# Patient Record
Sex: Female | Born: 1995 | Race: Black or African American | Hispanic: No | Marital: Single | Smoking: Never smoker
Health system: Southern US, Community
[De-identification: ages and names within clinical notes are randomized; demographics above are authoritative.]

## PROBLEM LIST (undated history)

## (undated) DIAGNOSIS — D649 Anemia, unspecified: Secondary | ICD-10-CM

---
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
## 2017-03-17 ENCOUNTER — Emergency Department (HOSPITAL_BASED_OUTPATIENT_CLINIC_OR_DEPARTMENT_OTHER)
Admission: EM | Admit: 2017-03-17 | Discharge: 2017-03-17 | Disposition: A | Discharge status: Home / Self Care | Payer: Medicaid Other | Attending: Emergency Medicine | Admitting: Emergency Medicine

## 2017-03-17 ENCOUNTER — Encounter (HOSPITAL_BASED_OUTPATIENT_CLINIC_OR_DEPARTMENT_OTHER): Payer: Self-pay | Admitting: Emergency Medicine

## 2017-03-17 DIAGNOSIS — R197 Diarrhea, unspecified: Secondary | ICD-10-CM

## 2017-03-17 DIAGNOSIS — K529 Noninfective gastroenteritis and colitis, unspecified: Secondary | ICD-10-CM | POA: Diagnosis not present

## 2017-03-17 DIAGNOSIS — R112 Nausea with vomiting, unspecified: Secondary | ICD-10-CM

## 2017-03-17 HISTORY — DX: Anemia, unspecified: D64.9

## 2017-03-17 LAB — COMPREHENSIVE METABOLIC PANEL
ALT: 14 U/L (ref 14–54)
ANION GAP: 5 (ref 5–15)
AST: 23 U/L (ref 15–41)
Albumin: 3.9 g/dL (ref 3.5–5.0)
Alkaline Phosphatase: 86 U/L (ref 38–126)
BUN: 11 mg/dL (ref 6–20)
CHLORIDE: 108 mmol/L (ref 101–111)
CO2: 23 mmol/L (ref 22–32)
Calcium: 9.4 mg/dL (ref 8.9–10.3)
Creatinine, Ser: 0.72 mg/dL (ref 0.44–1.00)
GFR calc non Af Amer: >60 mL/min (ref >60)
Glucose, Bld: 91 mg/dL (ref 65–99)
POTASSIUM: 3.7 mmol/L (ref 3.5–5.1)
SODIUM: 136 mmol/L (ref 135–145)
Total Bilirubin: 0.5 mg/dL (ref 0.3–1.2)
Total Protein: 7.9 g/dL (ref 6.5–8.1)

## 2017-03-17 LAB — CBC WITH DIFFERENTIAL/PLATELET
BASOS PCT: 1 %
Basophils Absolute: 0 10*3/uL (ref 0.0–0.1)
EOS PCT: 1 %
Eosinophils Absolute: 0 10*3/uL (ref 0.0–0.7)
HEMATOCRIT: 25.6 % — AB (ref 36.0–46.0)
HEMOGLOBIN: 7.6 g/dL — AB (ref 12.0–15.0)
LYMPHS PCT: 52 %
Lymphs Abs: 1.7 10*3/uL (ref 0.7–4.0)
MCH: 20.3 pg — ABNORMAL LOW (ref 26.0–34.0)
MCHC: 29.7 g/dL — ABNORMAL LOW (ref 30.0–36.0)
MCV: 68.4 fL — AB (ref 78.0–100.0)
MONOS PCT: 9 %
Monocytes Absolute: 0.3 10*3/uL (ref 0.1–1.0)
NEUTROS PCT: 37 %
Neutro Abs: 1.2 10*3/uL — ABNORMAL LOW (ref 1.7–7.7)
Platelets: 282 10*3/uL (ref 150–400)
RBC: 3.74 MIL/uL — ABNORMAL LOW (ref 3.87–5.11)
RDW: 18.4 % — ABNORMAL HIGH (ref 11.5–15.5)
WBC: 3.2 10*3/uL — ABNORMAL LOW (ref 4.0–10.5)

## 2017-03-17 LAB — URINALYSIS, ROUTINE W REFLEX MICROSCOPIC
BILIRUBIN URINE: NEGATIVE
Glucose, UA: NEGATIVE mg/dL
Hgb urine dipstick: NEGATIVE
Ketones, ur: NEGATIVE mg/dL
Leukocytes, UA: NEGATIVE
Nitrite: NEGATIVE
PROTEIN: NEGATIVE mg/dL
SPECIFIC GRAVITY, URINE: 1.01 (ref 1.005–1.030)
pH: 6 (ref 5.0–8.0)

## 2017-03-17 LAB — LIPASE, BLOOD: Lipase: 32 U/L (ref 11–51)

## 2017-03-17 LAB — PREGNANCY, URINE: Preg Test, Ur: NEGATIVE

## 2017-03-17 MED ORDER — DICYCLOMINE HCL 10 MG PO CAPS
10.0000 mg | ORAL_CAPSULE | Freq: Once | ORAL | Status: AC
  Administered 2017-03-17: 10 mg via ORAL
  Filled 2017-03-17: qty 1

## 2017-03-17 MED ORDER — DICYCLOMINE HCL 20 MG PO TABS: 20.0000 mg | ORAL_TABLET | Freq: Two times a day (BID) | ORAL | 0 refills | Status: DC

## 2017-03-17 MED ORDER — ONDANSETRON 4 MG PO TBDP
4.0000 mg | ORAL_TABLET | Freq: Once | ORAL | Status: AC
Start: 2017-03-17 — End: 2017-03-17
  Administered 2017-03-17: 4 mg via ORAL
  Filled 2017-03-17: qty 1

## 2017-03-17 MED ORDER — SODIUM CHLORIDE 0.9 % IV BOLUS (SEPSIS)
1000.0000 mL | Freq: Once | INTRAVENOUS | Status: AC
  Administered 2017-03-17: 1000 mL via INTRAVENOUS

## 2017-03-17 MED ORDER — ONDANSETRON HCL 4 MG PO TABS: 4.0000 mg | ORAL_TABLET | Freq: Three times a day (TID) | ORAL | 0 refills | Status: DC | PRN

## 2017-03-17 MED FILL — ONDANSETRON HCL 4 MG TABLET: 4 | 4 days supply | Qty: 10 | Fill #0

## 2017-03-17 MED FILL — DICYCLOMINE 20 MG TABLET: 20 | 10 days supply | Qty: 20 | Fill #0

## 2017-03-17 NOTE — ED Notes (Signed)
Pt states she has been around other people at school with similar Sx

## 2017-03-17 NOTE — ED Provider Notes (Signed)
[facility]   CSN: [id number] Arrival date & time: 03/17/17  1016     History   Chief Complaint Chief Complaint  Patient presents with  . Emesis    HPI Morgan Myers is a 21 y.o. female with a history of chronic iron deficiency anemia who presents emergency Department with chief complaint of nausea vomiting diarrhea. Patient states she had onset of nausea vomiting diarrhea yesterday. She had 2 episodes of voluminous vomiting and diarrhea. She denies any bloody or bilious vomitus and denies any melena or hematochezia. Stool was brown and watery. She had a friend at school who are having the same issues yesterday. She has some weakness when she stands and feels a bit lightheaded but denies any current nausea or vomiting. She did have some stomach cramps but no focal abdominal pain. She denies urinary or vaginal symptoms.  HPI  Past Medical History:  Diagnosis Date  . Anemia     There are no active problems to display for this patient.   History reviewed. No pertinent surgical history.  OB History    No data available       Home Medications    Prior to Admission medications   Not on File    Family History No family history on file.  Social History Social History  Substance Use Topics  . Smoking status: Never Smoker  . Smokeless tobacco: Never Used  . Alcohol use No     Allergies   Sulfa antibiotics   Review of Systems Review of Systems Ten systems reviewed and are negative for acute change, except as noted in the HPI.   Physical Exam Updated Vital Signs BP 113/73 (BP Location: Left Arm)   Pulse 81   Temp 98.5 F (36.9 C) (Oral)   Resp 18   Ht 5\' 4"  (1.626 m)   Wt 55.8 kg (123 lb)   LMP 03/09/2017   SpO2 100%   BMI 21.11 kg/m   Physical Exam  Physical Exam  Nursing note and vitals reviewed. Constitutional: She is oriented to person, place, and time. She appears well-developed and well-nourished. No distress.  HENT:    Head: Normocephalic and atraumatic.  Eyes: Conjunctivae normal and EOM are normal. Pupils are equal, round, and reactive to light. No scleral icterus.  Neck: Normal range of motion.  Cardiovascular: Normal rate, regular rhythm and normal heart sounds.  Exam reveals no gallop and no friction rub.   No murmur heard. Pulmonary/Chest: Effort normal and breath sounds normal. No respiratory distress.  Abdominal: Soft. Bowel sounds are normal. She exhibits no distension and no mass. There is no tenderness. There is no guarding.  Neurological: She is alert and oriented to person, place, and time.  Skin: Skin is warm and dry. She is not diaphoretic.    ED Treatments / Results  Labs (all labs ordered are listed, but only abnormal results are displayed) Labs Reviewed  CBC WITH DIFFERENTIAL/PLATELET - Abnormal; Notable for the following:       Result Value   WBC 3.2 (*)    RBC 3.74 (*)    Hemoglobin 7.6 (*)    HCT 25.6 (*)    MCV 68.4 (*)    MCH 20.3 (*)    MCHC 29.7 (*)    RDW 18.4 (*)    All other components within normal limits  URINALYSIS, ROUTINE W REFLEX MICROSCOPIC  PREGNANCY, URINE  COMPREHENSIVE METABOLIC PANEL  LIPASE, BLOOD    EKG  EKG Interpretation None  Radiology No results found.  Procedures Procedures (including critical care time)  Medications Ordered in ED Medications  sodium chloride 0.9 % bolus 1,000 mL (1,000 mLs Intravenous New Bag/Given 03/17/17 1235)  dicyclomine (BENTYL) capsule 10 mg (10 mg Oral Given 03/17/17 1242)  ondansetron (ZOFRAN-ODT) disintegrating tablet 4 mg (4 mg Oral Given 03/17/17 1243)     Initial Impression / Assessment and Plan / ED Course  I have reviewed the triage vital signs and the nursing notes.  Pertinent labs & imaging results that were available during my care of the patient were reviewed by me and considered in my medical decision making (see chart for details).     Patient with symptoms consistent with viral  gastroenteritis.  Vitals are stable, no fever.  No signs of dehydration, tolerating PO fluids > 6 oz.  Lungs are clear.  No focal abdominal pain, no concern for appendicitis, cholecystitis, pancreatitis, ruptured viscus, UTI, kidney stone, or any other abdominal etiology.  Supportive therapy indicated with return if symptoms worsen.  Patient counseled.   Final Clinical Impressions(s) / ED Diagnoses   Final diagnoses:  Nausea vomiting and diarrhea  Gastroenteritis    New Prescriptions New Prescriptions   No medications on file     Arthor Captain, PA-C 03/17/17 1308    Tilden Fossa, MD 03/20/17 1002

## 2017-03-17 NOTE — Discharge Instructions (Signed)

## 2017-03-17 NOTE — ED Notes (Signed)
Pt has a ride a bedside. Directed to pharmacy to pick up medications

## 2017-03-17 NOTE — ED Triage Notes (Signed)
Pt states she has had N/V/D since yesterday.  Pt states she has had 2 episodes of vomiting and diarrhea.  NAD.  Pt states friends at school have been sick with same.

## 2017-09-21 ENCOUNTER — Encounter (HOSPITAL_BASED_OUTPATIENT_CLINIC_OR_DEPARTMENT_OTHER): Payer: Self-pay | Admitting: Emergency Medicine

## 2017-09-21 ENCOUNTER — Other Ambulatory Visit: Payer: Self-pay

## 2017-09-21 ENCOUNTER — Emergency Department (HOSPITAL_BASED_OUTPATIENT_CLINIC_OR_DEPARTMENT_OTHER)
Admission: EM | Admit: 2017-09-21 | Discharge: 2017-09-21 | Disposition: A | Discharge status: Home / Self Care | Payer: Medicaid Other | Attending: Emergency Medicine | Admitting: Emergency Medicine

## 2017-09-21 DIAGNOSIS — R55 Syncope and collapse: Secondary | ICD-10-CM

## 2017-09-21 DIAGNOSIS — R42 Dizziness and giddiness: Secondary | ICD-10-CM | POA: Diagnosis present

## 2017-09-21 DIAGNOSIS — I951 Orthostatic hypotension: Secondary | ICD-10-CM

## 2017-09-21 LAB — BASIC METABOLIC PANEL
Anion gap: 8 (ref 5–15)
BUN: 11 mg/dL (ref 6–20)
CHLORIDE: 107 mmol/L (ref 101–111)
CO2: 22 mmol/L (ref 22–32)
CREATININE: 0.85 mg/dL (ref 0.44–1.00)
Calcium: 9.3 mg/dL (ref 8.9–10.3)
GFR calc Af Amer: >60 mL/min (ref >60)
GFR calc non Af Amer: >60 mL/min (ref >60)
GLUCOSE: 95 mg/dL (ref 65–99)
POTASSIUM: 3.7 mmol/L (ref 3.5–5.1)
Sodium: 137 mmol/L (ref 135–145)

## 2017-09-21 LAB — CBC
HEMATOCRIT: 26.7 % — AB (ref 36.0–46.0)
Hemoglobin: 7.8 g/dL — ABNORMAL LOW (ref 12.0–15.0)
MCH: 19.6 pg — ABNORMAL LOW (ref 26.0–34.0)
MCHC: 29.2 g/dL — AB (ref 30.0–36.0)
MCV: 67.1 fL — AB (ref 78.0–100.0)
Platelets: 338 10*3/uL (ref 150–400)
RBC: 3.98 MIL/uL (ref 3.87–5.11)
RDW: 18 % — AB (ref 11.5–15.5)
WBC: 5 10*3/uL (ref 4.0–10.5)

## 2017-09-21 LAB — URINALYSIS, ROUTINE W REFLEX MICROSCOPIC
BILIRUBIN URINE: NEGATIVE
GLUCOSE, UA: NEGATIVE mg/dL
HGB URINE DIPSTICK: NEGATIVE
KETONES UR: NEGATIVE mg/dL
Nitrite: NEGATIVE
PH: 6 (ref 5.0–8.0)
Protein, ur: NEGATIVE mg/dL
Specific Gravity, Urine: 1.025 (ref 1.005–1.030)

## 2017-09-21 LAB — URINALYSIS, MICROSCOPIC (REFLEX)

## 2017-09-21 LAB — PREGNANCY, URINE: PREG TEST UR: NEGATIVE

## 2017-09-21 LAB — CBG MONITORING, ED: Glucose-Capillary: 70 mg/dL (ref 65–99)

## 2017-09-21 MED ORDER — SODIUM CHLORIDE 0.9 % IV BOLUS (SEPSIS)
1000.0000 mL | Freq: Once | INTRAVENOUS | Status: AC
  Administered 2017-09-21: 1000 mL via INTRAVENOUS

## 2017-09-21 NOTE — ED Triage Notes (Signed)
Pt reports she was at school today and became dizzy with darkness to her vision around 2pm. Pt now reports she has a headache. Pt has not eaten today.

## 2017-09-21 NOTE — ED Notes (Signed)
Pt was given snack and something to drink per provider

## 2017-09-21 NOTE — Discharge Instructions (Signed)
As we discussed, you need to follow-up with your primary care doctor regarding your anemia.  Make sure you are eating and drinking appropriately.  Follow-up with your primary care doctor the next 24-48 hours for further evaluation.  Return to the emergency department for any fever, chest pain, dizziness, vision changes, difficulty walking, numbness/weakness of your arms or legs.

## 2017-09-21 NOTE — ED Provider Notes (Signed)
[facility] EMERGENCY DEPARTMENT Provider Note   CSN: [id number] Arrival date & time: 09/21/17  1558     History   Chief Complaint Chief Complaint  Patient presents with  . Dizziness    HPI Morgan Myers is a 22 y.o. female who presents for evaluation of lightheadedness and dizziness that occurred this afternoon. Patient reports that she was at school when she started feeling lightheaded and dizziness. Patient was concerned that she would pass out. Patient reports she had associated blurry vision that she describes as "black spots everywhere like when you've been looking at the sun too long." She states that she never had any complete vision loss. Patient states that she did not eat anything all day. Patient states that the symptoms resolved on their own.  Patient states that she did not have any preceding trauma, injury, fall.  On ED arrival, symptoms have resolved.  Patient denies any chest pain, difficulty breathing, abdominal pain, nausea/vomiting, numbness/weakness of her arms or legs.  The history is provided by the patient.    Past Medical History:  Diagnosis Date  . Anemia     There are no active problems to display for this patient.   History reviewed. No pertinent surgical history.  OB History    No data available       Home Medications    Prior to Admission medications   Medication Sig Start Date End Date Taking? Authorizing Provider  dicyclomine (BENTYL) 20 MG tablet Take 1 tablet (20 mg total) by mouth 2 (two) times daily. 03/17/17   Arthor Captain, PA-C  ondansetron (ZOFRAN) 4 MG tablet Take 1 tablet (4 mg total) by mouth every 8 (eight) hours as needed for nausea or vomiting. 03/17/17   Arthor Captain, PA-C    Family History No family history on file.  Social History Social History   Tobacco Use  . Smoking status: Never Smoker  . Smokeless tobacco: Never Used  Substance Use Topics  . Alcohol use: No  . Drug use: No     Allergies     Sulfa antibiotics   Review of Systems Review of Systems  Constitutional: Negative for chills and fever.  HENT: Negative for congestion.   Eyes: Positive for visual disturbance.  Respiratory: Negative for cough and shortness of breath.   Cardiovascular: Negative for chest pain.  Gastrointestinal: Negative for abdominal pain, diarrhea, nausea and vomiting.  Genitourinary: Negative for dysuria and hematuria.  Musculoskeletal: Negative for back pain and neck pain.  Skin: Negative for rash.  Neurological: Positive for dizziness. Negative for weakness, numbness and headaches.  Psychiatric/Behavioral: Negative for confusion.     Physical Exam Updated Vital Signs BP 110/71 (BP Location: Left Arm)   Pulse 93   Temp 98.7 F (37.1 C) (Oral)   Resp (!) 23   Ht 5\' 4"  (1.626 m)   LMP 09/14/2017   SpO2 100%   BMI 21.11 kg/m   Physical Exam  Constitutional: She is oriented to person, place, and time. She appears well-developed and well-nourished.  HENT:  Head: Normocephalic and atraumatic.  Mouth/Throat: Oropharynx is clear and moist and mucous membranes are normal.  Eyes: Conjunctivae, EOM and lids are normal. Pupils are equal, round, and reactive to light.  No nystagmus  Neck: Full passive range of motion without pain.  Cardiovascular: Normal rate, regular rhythm, normal heart sounds and normal pulses. Exam reveals no gallop and no friction rub.  No murmur heard. Pulmonary/Chest: Effort normal and breath sounds normal.  Abdominal: Soft.  Normal appearance. There is no tenderness. There is no rigidity and no guarding.  Musculoskeletal: Normal range of motion.  Neurological: She is alert and oriented to person, place, and time.  Cranial nerves III-XII intact Follows commands, Moves all extremities  5/5 strength to BUE and BLE  Sensation intact throughout all major nerve distributions Normal finger to nose. No dysdiadochokinesia. No pronator drift. No gait abnormalities  No  slurred speech. No facial droop.   Skin: Skin is warm and dry. Capillary refill takes less than 2 seconds.  Psychiatric: She has a normal mood and affect. Her speech is normal.  Nursing note and vitals reviewed.    ED Treatments / Results  Labs (all labs ordered are listed, but only abnormal results are displayed) Labs Reviewed  CBC - Abnormal; Notable for the following components:      Result Value   Hemoglobin 7.8 (*)    HCT 26.7 (*)    MCV 67.1 (*)    MCH 19.6 (*)    MCHC 29.2 (*)    RDW 18.0 (*)    All other components within normal limits  URINALYSIS, ROUTINE W REFLEX MICROSCOPIC - Abnormal; Notable for the following components:   APPearance HAZY (*)    Leukocytes, UA TRACE (*)    All other components within normal limits  URINALYSIS, MICROSCOPIC (REFLEX) - Abnormal; Notable for the following components:   Bacteria, UA MANY (*)    Squamous Epithelial / LPF 6-30 (*)    All other components within normal limits  BASIC METABOLIC PANEL  PREGNANCY, URINE  CBG MONITORING, ED    EKG  EKG Interpretation  Date/Time:  Tuesday September 21 2017 20:12:04 EDT Ventricular Rate:  78 PR Interval:    QRS Duration: 87 QT Interval:  360 QTC Calculation: 410 R Axis:   64 Text Interpretation:  Sinus rhythm No old tracing to compare Confirmed by Rolan Bucco [phone]) on 09/21/2017 9:01:05 PM       Radiology No results found.  Procedures Procedures (including critical care time)  Medications Ordered in ED Medications  sodium chloride 0.9 % bolus 1,000 mL (0 mLs Intravenous Stopped 09/21/17 2140)     Initial Impression / Assessment and Plan / ED Course  I have reviewed the triage vital signs and the nursing notes.  Pertinent labs & imaging results that were available during my care of the patient were reviewed by me and considered in my medical decision making (see chart for details).     22 y.o. F who presents for evaluation of lightheadedness, dizziness, near syncope that  occurred this afternoon.  Associate with some biliary vision.  Prevent to have resolved on their own.  On ED arrival, patient is symptomatic.  No chest pain, difficulty breathing, fever. Patient is afebrile, non-toxic appearing, sitting comfortably on examination table. Vital signs reviewed and stable.  No neuro deficits noted on exam.  Patient is able to ambulate in the department without any difficulty.  Consider dehydration versus orthostatic hypotension versus near syncope.  History/physical exam is not concerning for CVA, TIA.  Plan to check basic labs, EKG, orthostatic vital signs.  Will plan to give IVF for fluid resuscitation.  CBC with no significant leukocytosis.  Patient does have anemia at 7.8 and 26.7.  Patient has a known history of anemia.  Patient reports she is supposed to be taking oral iron pills that she states that she does not tolerate them.  She denies any melena, bright red blood per rectum, bleeding.  Records  reviewed to this consistent with her hemoglobin and hematocrit that were 6 months ago.   BMP is unremarkable.  UA shows trace leukocytes.patient was orthostatic, she is finishing her bolus of fluids.  Reevaluation.  Patient is ambulating in the part without any difficulty.  She is tolerating p.o. in the department without any difficulty.  I suspect the symptoms may be related to orthostatic hypotension or patient's anemia.  Instructed patient to follow-up with her pediatrician in order to have anemia reevaluated and see if she can have iron supplementation.  Encouraged her to take her oral iron pills.  Vitals stable at this time. Patient had ample opportunity for questions and discussion. All patient's questions were answered with full understanding. Strict return precautions discussed. Patient expresses understanding and agreement to plan.    Orthostatic VS for the past 24 hrs:  BP- Lying Pulse- Lying BP- Sitting Pulse- Sitting BP- Standing at 0 minutes Pulse- Standing at 0  minutes  09/21/17 1957 115/75 88 121/81 80 107/74 98      Final Clinical Impressions(s) / ED Diagnoses   Final diagnoses:  Near syncope  Orthostatic hypotension    ED Discharge Orders    None       Maxwell CaulLayden, Paulanthony Gleaves A, PA-C 09/22/17 0255    Rolan BuccoBelfi, Melanie, MD 09/22/17 1421

## 2018-02-09 ENCOUNTER — Encounter (HOSPITAL_BASED_OUTPATIENT_CLINIC_OR_DEPARTMENT_OTHER): Payer: Self-pay

## 2018-02-09 ENCOUNTER — Emergency Department (HOSPITAL_BASED_OUTPATIENT_CLINIC_OR_DEPARTMENT_OTHER)
Admission: EM | Admit: 2018-02-09 | Discharge: 2018-02-10 | Disposition: A | Discharge status: Home / Self Care | Payer: Self-pay | Attending: Emergency Medicine | Admitting: Emergency Medicine

## 2018-02-09 ENCOUNTER — Other Ambulatory Visit: Payer: Self-pay

## 2018-02-09 DIAGNOSIS — R1084 Generalized abdominal pain: Secondary | ICD-10-CM

## 2018-02-09 LAB — PREGNANCY, URINE: Preg Test, Ur: NEGATIVE

## 2018-02-09 LAB — URINALYSIS, ROUTINE W REFLEX MICROSCOPIC
Bilirubin Urine: NEGATIVE
GLUCOSE, UA: NEGATIVE mg/dL
Hgb urine dipstick: NEGATIVE
KETONES UR: NEGATIVE mg/dL
Nitrite: NEGATIVE
PROTEIN: NEGATIVE mg/dL
Specific Gravity, Urine: 1.01 (ref 1.005–1.030)
pH: 6.5 (ref 5.0–8.0)

## 2018-02-09 LAB — URINALYSIS, MICROSCOPIC (REFLEX)

## 2018-02-09 NOTE — ED Provider Notes (Signed)
[facility] EMERGENCY DEPARTMENT Provider Note   CSN: [id number] Arrival date & time: 02/09/18  2232     History   Chief Complaint Chief Complaint  Patient presents with  . Abdominal Pain    HPI Morgan Myers is a 22 y.o. female.  Presents to the emergency department for evaluation of abdominal pain.  Patient reports that her pain began yesterday.  She had several hours of diffuse crampy pain that ultimately went away without intervention.  Pain came back tonight while she was at work.  She denies nausea, vomiting, diarrhea, constipation.  She has not had any urinary symptoms.  She denies vaginal bleeding, discharge.     Past Medical History:  Diagnosis Date  . Anemia     There are no active problems to display for this patient.   History reviewed. No pertinent surgical history.   OB History   None      Home Medications    Prior to Admission medications   Medication Sig Start Date End Date Taking? Authorizing Provider  dicyclomine (BENTYL) 20 MG tablet Take 1 tablet (20 mg total) by mouth 2 (two) times daily as needed for spasms. 02/10/18   Gilda Crease, MD  ibuprofen (ADVIL,MOTRIN) 800 MG tablet Take 1 tablet (800 mg total) by mouth every 8 (eight) hours as needed for moderate pain. 02/10/18   Gilda Crease, MD    Family History No family history on file.  Social History Social History   Tobacco Use  . Smoking status: Never Smoker  . Smokeless tobacco: Never Used  Substance Use Topics  . Alcohol use: No  . Drug use: No     Allergies   Sulfa antibiotics   Review of Systems Review of Systems  Gastrointestinal: Positive for abdominal pain.  All other systems reviewed and are negative.    Physical Exam Updated Vital Signs BP 139/84 (BP Location: Left Arm)   Pulse 92   Temp 98 F (36.7 C) (Oral)   Resp 18   Ht 5\' 4"  (1.626 m)   Wt 56.7 kg (125 lb)   LMP 01/26/2018 (Approximate)   SpO2 100%   BMI 21.46 kg/m    Physical Exam  Constitutional: She is oriented to person, place, and time. She appears well-developed and well-nourished. No distress.  HENT:  Head: Normocephalic and atraumatic.  Right Ear: Hearing normal.  Left Ear: Hearing normal.  Nose: Nose normal.  Mouth/Throat: Oropharynx is clear and moist and mucous membranes are normal.  Eyes: Pupils are equal, round, and reactive to light. Conjunctivae and EOM are normal.  Neck: Normal range of motion. Neck supple.  Cardiovascular: Regular rhythm, S1 normal and S2 normal. Exam reveals no gallop and no friction rub.  No murmur heard. Pulmonary/Chest: Effort normal and breath sounds normal. No respiratory distress. She exhibits no tenderness.  Abdominal: Soft. Normal appearance and bowel sounds are normal. There is no hepatosplenomegaly. There is generalized tenderness (Tenderness is generalized, all 4 quadrants but is worse in the left upper quadrant and left lower quadrant). There is no rebound, no guarding, no tenderness at McBurney's point and negative Murphy's sign. No hernia.  Musculoskeletal: Normal range of motion.  Neurological: She is alert and oriented to person, place, and time. She has normal strength. No cranial nerve deficit or sensory deficit. Coordination normal. GCS eye subscore is 4. GCS verbal subscore is 5. GCS motor subscore is 6.  Skin: Skin is warm, dry and intact. No rash noted. No cyanosis.  Psychiatric:  She has a normal mood and affect. Her speech is normal and behavior is normal. Thought content normal.  Nursing note and vitals reviewed.    ED Treatments / Results  Labs (all labs ordered are listed, but only abnormal results are displayed) Labs Reviewed  CBC WITH DIFFERENTIAL/PLATELET - Abnormal; Notable for the following components:      Result Value   Hemoglobin 8.2 (*)    HCT 27.8 (*)    MCV 65.6 (*)    MCH 19.3 (*)    MCHC 29.5 (*)    RDW 18.3 (*)    All other components within normal limits   COMPREHENSIVE METABOLIC PANEL - Abnormal; Notable for the following components:   Total Protein 8.5 (*)    All other components within normal limits  URINALYSIS, ROUTINE W REFLEX MICROSCOPIC - Abnormal; Notable for the following components:   APPearance CLOUDY (*)    Leukocytes, UA LARGE (*)    All other components within normal limits  URINALYSIS, MICROSCOPIC (REFLEX) - Abnormal; Notable for the following components:   Bacteria, UA MANY (*)    All other components within normal limits  LIPASE, BLOOD  PREGNANCY, URINE    EKG None  Radiology Dg Abd Acute W/chest  Result Date: 02/10/2018 CLINICAL DATA:  Abdominal pain. EXAM: DG ABDOMEN ACUTE W/ 1V CHEST COMPARISON:  None. FINDINGS: The cardiomediastinal contours are normal. The lungs are clear. There is no free intra-abdominal air. No dilated bowel loops to suggest obstruction. Small volume of stool throughout the colon. No radiopaque calculi. No acute osseous abnormalities are seen. Trace dextroscoliotic curvature of the thoracolumbar spine. IMPRESSION: 1. Unremarkable radiographs of the chest and abdomen. 2. Mild thoracolumbar scoliosis. Electronically Signed   By: Rubye Oaks M.D.   On: 02/10/2018 01:21    Procedures Procedures (including critical care time)  Medications Ordered in ED Medications  morphine 2 MG/ML injection 2 mg (has no administration in time range)  ondansetron (ZOFRAN) injection 4 mg (has no administration in time range)     Initial Impression / Assessment and Plan / ED Course  I have reviewed the triage vital signs and the nursing notes.  Pertinent labs & imaging results that were available during my care of the patient were reviewed by me and considered in my medical decision making (see chart for details).     Patient presents to the emergency department for evaluation of abdominal pain.  Pain started after eating this morning.  She also had an episode yesterday that spontaneously resolved.   Examination is nonfocal.  She has diffuse tenderness but does not have any guarding or rebound.  Tenderness is worse in the left upper and left lower than it is on the right, therefore low suspicion for gallbladder disease and appendicitis at this time.  Lab work is normal.  Urinalysis is a contaminated specimen, but symptoms unlikely to be secondary to UTI.  X-ray unremarkable.  Discussed with patient options.  Discussed symptomatic treatment and monitoring over the next 12 hours or so, return if symptoms worsen versus doing a CAT scan.  Discussed risks of radiation exposure.  Patient expresses understanding of this conversation and is comfortable going home with some treatment of her symptoms and agrees to return if she develops a fever or worsening pain.  She understands that we have not ruled out appendicitis at this time.  Final Clinical Impressions(s) / ED Diagnoses   Final diagnoses:  Generalized abdominal pain    ED Discharge Orders  Ordered    dicyclomine (BENTYL) 20 MG tablet  2 times daily PRN     02/10/18 0138    ibuprofen (ADVIL,MOTRIN) 800 MG tablet  Every 8 hours PRN     02/10/18 0138       Gilda CreasePollina, Maliyah Willets J, MD 02/10/18 [phone]

## 2018-02-09 NOTE — ED Triage Notes (Signed)
C/o abd started after eating questionable food-denies v/d-NAD-steady gait

## 2018-02-10 ENCOUNTER — Emergency Department (HOSPITAL_BASED_OUTPATIENT_CLINIC_OR_DEPARTMENT_OTHER): Payer: Self-pay

## 2018-02-10 LAB — COMPREHENSIVE METABOLIC PANEL
ALT: 12 U/L (ref 0–44)
AST: 24 U/L (ref 15–41)
Albumin: 4.2 g/dL (ref 3.5–5.0)
Alkaline Phosphatase: 97 U/L (ref 38–126)
Anion gap: 8 (ref 5–15)
BUN: 6 mg/dL (ref 6–20)
CO2: 24 mmol/L (ref 22–32)
Calcium: 9.4 mg/dL (ref 8.9–10.3)
Chloride: 103 mmol/L (ref 98–111)
Creatinine, Ser: 0.77 mg/dL (ref 0.44–1.00)
Glucose, Bld: 99 mg/dL (ref 70–99)
POTASSIUM: 3.5 mmol/L (ref 3.5–5.1)
Sodium: 135 mmol/L (ref 135–145)
TOTAL PROTEIN: 8.5 g/dL — AB (ref 6.5–8.1)
Total Bilirubin: 0.9 mg/dL (ref 0.3–1.2)

## 2018-02-10 LAB — CBC WITH DIFFERENTIAL/PLATELET
BASOS ABS: 0 10*3/uL (ref 0.0–0.1)
Basophils Relative: 0 %
EOS ABS: 0.1 10*3/uL (ref 0.0–0.7)
Eosinophils Relative: 1 %
HCT: 27.8 % — ABNORMAL LOW (ref 36.0–46.0)
Hemoglobin: 8.2 g/dL — ABNORMAL LOW (ref 12.0–15.0)
LYMPHS ABS: 2.2 10*3/uL (ref 0.7–4.0)
Lymphocytes Relative: 45 %
MCH: 19.3 pg — ABNORMAL LOW (ref 26.0–34.0)
MCHC: 29.5 g/dL — AB (ref 30.0–36.0)
MCV: 65.6 fL — ABNORMAL LOW (ref 78.0–100.0)
Monocytes Absolute: 0.4 10*3/uL (ref 0.1–1.0)
Monocytes Relative: 8 %
NEUTROS ABS: 2.2 10*3/uL (ref 1.7–7.7)
Neutrophils Relative %: 46 %
Platelets: 266 10*3/uL (ref 150–400)
RBC: 4.24 MIL/uL (ref 3.87–5.11)
RDW: 18.3 % — AB (ref 11.5–15.5)
WBC: 4.9 10*3/uL (ref 4.0–10.5)

## 2018-02-10 LAB — LIPASE, BLOOD: LIPASE: 32 U/L (ref 11–51)

## 2018-02-10 MED ORDER — DICYCLOMINE HCL 20 MG PO TABS: 20.0000 mg | ORAL_TABLET | Freq: Two times a day (BID) | ORAL | 0 refills | Status: DC | PRN

## 2018-02-10 MED ORDER — IBUPROFEN 800 MG PO TABS: 800.0000 mg | ORAL_TABLET | Freq: Three times a day (TID) | ORAL | 0 refills | Status: DC | PRN

## 2018-02-10 MED ORDER — ONDANSETRON HCL 4 MG/2ML IJ SOLN
4.0000 mg | Freq: Once | INTRAMUSCULAR | Status: AC
  Administered 2018-02-10: 4 mg via INTRAVENOUS
  Filled 2018-02-10: qty 2

## 2018-02-10 MED ORDER — MORPHINE SULFATE (PF) 2 MG/ML IV SOLN
2.0000 mg | Freq: Once | INTRAVENOUS | Status: AC
  Administered 2018-02-10: 2 mg via INTRAVENOUS
  Filled 2018-02-10: qty 1

## 2018-05-25 ENCOUNTER — Other Ambulatory Visit: Payer: Self-pay

## 2018-05-25 ENCOUNTER — Encounter (HOSPITAL_BASED_OUTPATIENT_CLINIC_OR_DEPARTMENT_OTHER): Payer: Self-pay

## 2018-05-25 ENCOUNTER — Emergency Department (HOSPITAL_BASED_OUTPATIENT_CLINIC_OR_DEPARTMENT_OTHER)
Admission: EM | Admit: 2018-05-25 | Discharge: 2018-05-25 | Disposition: A | Discharge status: Home / Self Care | Payer: Medicaid Other | Attending: Emergency Medicine | Admitting: Emergency Medicine

## 2018-05-25 DIAGNOSIS — N946 Dysmenorrhea, unspecified: Secondary | ICD-10-CM | POA: Insufficient documentation

## 2018-05-25 LAB — URINALYSIS, ROUTINE W REFLEX MICROSCOPIC
Bilirubin Urine: NEGATIVE
GLUCOSE, UA: NEGATIVE mg/dL
KETONES UR: 15 mg/dL — AB
LEUKOCYTES UA: NEGATIVE
NITRITE: NEGATIVE
PROTEIN: NEGATIVE mg/dL
Specific Gravity, Urine: 1.025 (ref 1.005–1.030)
pH: 6 (ref 5.0–8.0)

## 2018-05-25 LAB — URINALYSIS, MICROSCOPIC (REFLEX)

## 2018-05-25 LAB — PREGNANCY, URINE: PREG TEST UR: NEGATIVE

## 2018-05-25 MED ORDER — KETOROLAC TROMETHAMINE 30 MG/ML IJ SOLN
30.0000 mg | Freq: Once | INTRAMUSCULAR | Status: AC
  Administered 2018-05-25: 30 mg via INTRAMUSCULAR
  Filled 2018-05-25: qty 1

## 2018-05-25 NOTE — ED Provider Notes (Addendum)
[facility] EMERGENCY DEPARTMENT Provider Note   CSN: [id number] Arrival date & time: 05/25/18  2049     History   Chief Complaint Chief Complaint  Patient presents with  . Dysmenorrhea    HPI Morgan Myers is a 22 y.o. female.  HPI Patient presents with menstrual cramps.  States began around 3 days ago.  States that normally she has severe cramps and Motrin helps to go away but states she took 800 mg of Motrin it did not help.  Denies vaginal discharge.  States she had a negative pregnancy test at home.  States she does not think she is pregnant but could not have an STD.  Pain is in her lower abdomen and crampy.  States she has had pains like this before.  No fevers.  No dysuria. Past Medical History:  Diagnosis Date  . Anemia     There are no active problems to display for this patient.   History reviewed. No pertinent surgical history.   OB History   None      Home Medications    Prior to Admission medications   Medication Sig Start Date End Date Taking? Authorizing Provider  dicyclomine (BENTYL) 20 MG tablet Take 1 tablet (20 mg total) by mouth 2 (two) times daily as needed for spasms. 02/10/18   Gilda CreasePollina, Christopher J, MD  ibuprofen (ADVIL,MOTRIN) 800 MG tablet Take 1 tablet (800 mg total) by mouth every 8 (eight) hours as needed for moderate pain. 02/10/18   Gilda CreasePollina, Christopher J, MD    Family History No family history on file.  Social History Social History   Tobacco Use  . Smoking status: Never Smoker  . Smokeless tobacco: Never Used  Substance Use Topics  . Alcohol use: No  . Drug use: No     Allergies   Iron and Sulfa antibiotics   Review of Systems Review of Systems  Constitutional: Negative for appetite change.  HENT: Negative for congestion.   Respiratory: Negative for shortness of breath.   Cardiovascular: Negative for chest pain.  Gastrointestinal: Positive for abdominal pain.  Genitourinary: Positive for vaginal bleeding.  Negative for vaginal discharge and vaginal pain.  Musculoskeletal: Negative for back pain.  Skin: Negative for rash.  Neurological: Negative for weakness.  Hematological: Negative for adenopathy.  Psychiatric/Behavioral: Negative for confusion.     Physical Exam Updated Vital Signs BP 113/77 (BP Location: Right Arm)   Pulse 84   Temp 99.1 F (37.3 C) (Oral)   Resp 18   Ht 5\' 4"  (1.626 m)   Wt 57.6 kg   LMP 05/22/2018   SpO2 100%   BMI 21.80 kg/m   Physical Exam  Constitutional: She appears well-developed.  HENT:  Head: Normocephalic.  Eyes: EOM are normal.  Neck: Neck supple.  Cardiovascular: Normal rate.  Abdominal: There is tenderness.  Lower abdominal tenderness without rebound or guarding.  Musculoskeletal: She exhibits no edema.  Neurological: She is alert.  Skin: Skin is warm. Capillary refill takes less than 2 seconds.     ED Treatments / Results  Labs (all labs ordered are listed, but only abnormal results are displayed) Labs Reviewed  URINALYSIS, ROUTINE W REFLEX MICROSCOPIC - Abnormal; Notable for the following components:      Result Value   APPearance HAZY (*)    Hgb urine dipstick LARGE (*)    Ketones, ur 15 (*)    All other components within normal limits  URINALYSIS, MICROSCOPIC (REFLEX) - Abnormal; Notable for the following components:  Bacteria, UA MANY (*)    All other components within normal limits  PREGNANCY, URINE    EKG None  Radiology No results found.  Procedures Procedures (including critical care time)  Medications Ordered in ED Medications  ketorolac (TORADOL) 30 MG/ML injection 30 mg (has no administration in time range)     Initial Impression / Assessment and Plan / ED Course  I have reviewed the triage vital signs and the nursing notes.  Pertinent labs & imaging results that were available during my care of the patient were reviewed by me and considered in my medical decision making (see chart for details).      Patient with painful menses.  History of same.  Not pregnant.  Urine shows bacteria without white cells.  Will treat with Toradol.  Likely discharge home.  Pelvic exam deferred for now.   Given Toradol.  Think this will help patient feel better.  Discharge home.  If develops fever or increased pain may need further evaluation.  Final Clinical Impressions(s) / ED Diagnoses   Final diagnoses:  Dysmenorrhea    ED Discharge Orders    None       Benjiman Core, MD 05/25/18 2329    Benjiman Core, MD 05/25/18 [phone]

## 2018-05-25 NOTE — ED Notes (Signed)
Pt talking on Facetime when this RN entered room. Asked pt if she produced a urine specimen yet, pt sts "Oh, I'm bout to now".

## 2018-05-25 NOTE — ED Triage Notes (Signed)
C/o menstrual cramps x 3 days-NAD-steady gait

## 2018-05-25 NOTE — ED Notes (Signed)
ED Provider at bedside. 

## 2020-01-29 ENCOUNTER — Encounter (HOSPITAL_COMMUNITY): Payer: Self-pay

## 2020-01-29 ENCOUNTER — Emergency Department (HOSPITAL_COMMUNITY)
Admission: EM | Admit: 2020-01-29 | Discharge: 2020-01-29 | Disposition: A | Discharge status: Home / Self Care | Payer: Medicaid Other | Attending: Emergency Medicine | Admitting: Emergency Medicine

## 2020-01-29 ENCOUNTER — Other Ambulatory Visit: Payer: Self-pay

## 2020-01-29 DIAGNOSIS — Z5321 Procedure and treatment not carried out due to patient leaving prior to being seen by health care provider: Secondary | ICD-10-CM | POA: Insufficient documentation

## 2020-01-29 DIAGNOSIS — R5383 Other fatigue: Secondary | ICD-10-CM | POA: Insufficient documentation

## 2020-01-29 NOTE — ED Triage Notes (Signed)
Patient c/o cough and fatigue x 2 days. Patient states she took her brother's sinus congestion medicine and has been feeling tired since.

## 2020-02-27 IMAGING — DX DG ABDOMEN ACUTE W/ 1V CHEST
3 series · 3 of 3 positions shown · non-contrast
Comparison: None.

CLINICAL DATA: Abdominal pain.

EXAM:
DG ABDOMEN ACUTE W/ 1V CHEST

[chest pa]
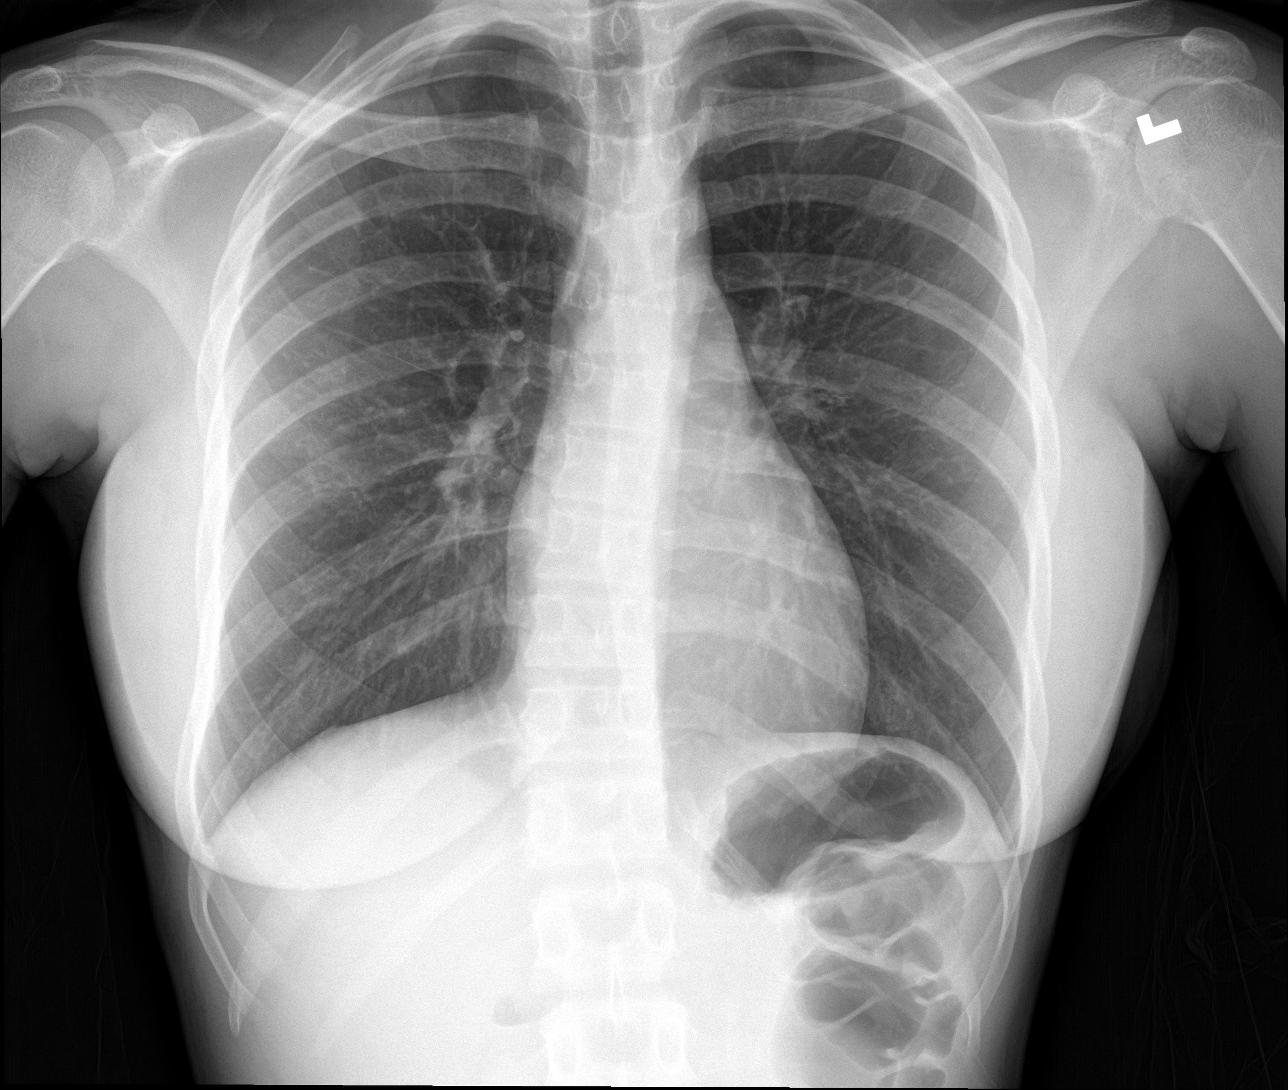

[abdomen erect]
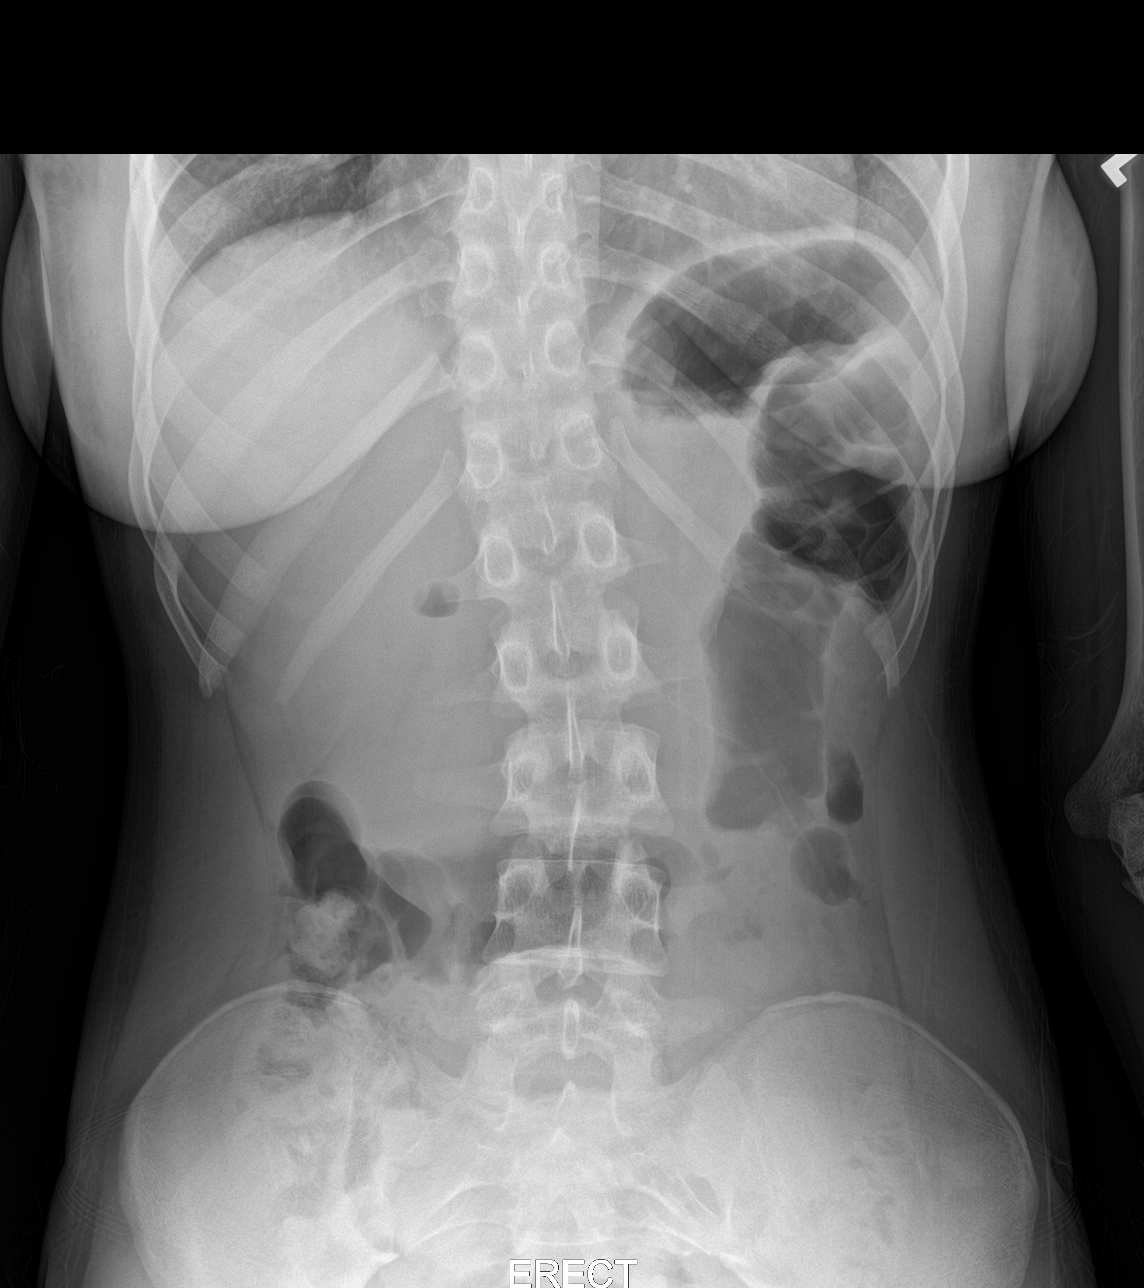

[abdomen supine]
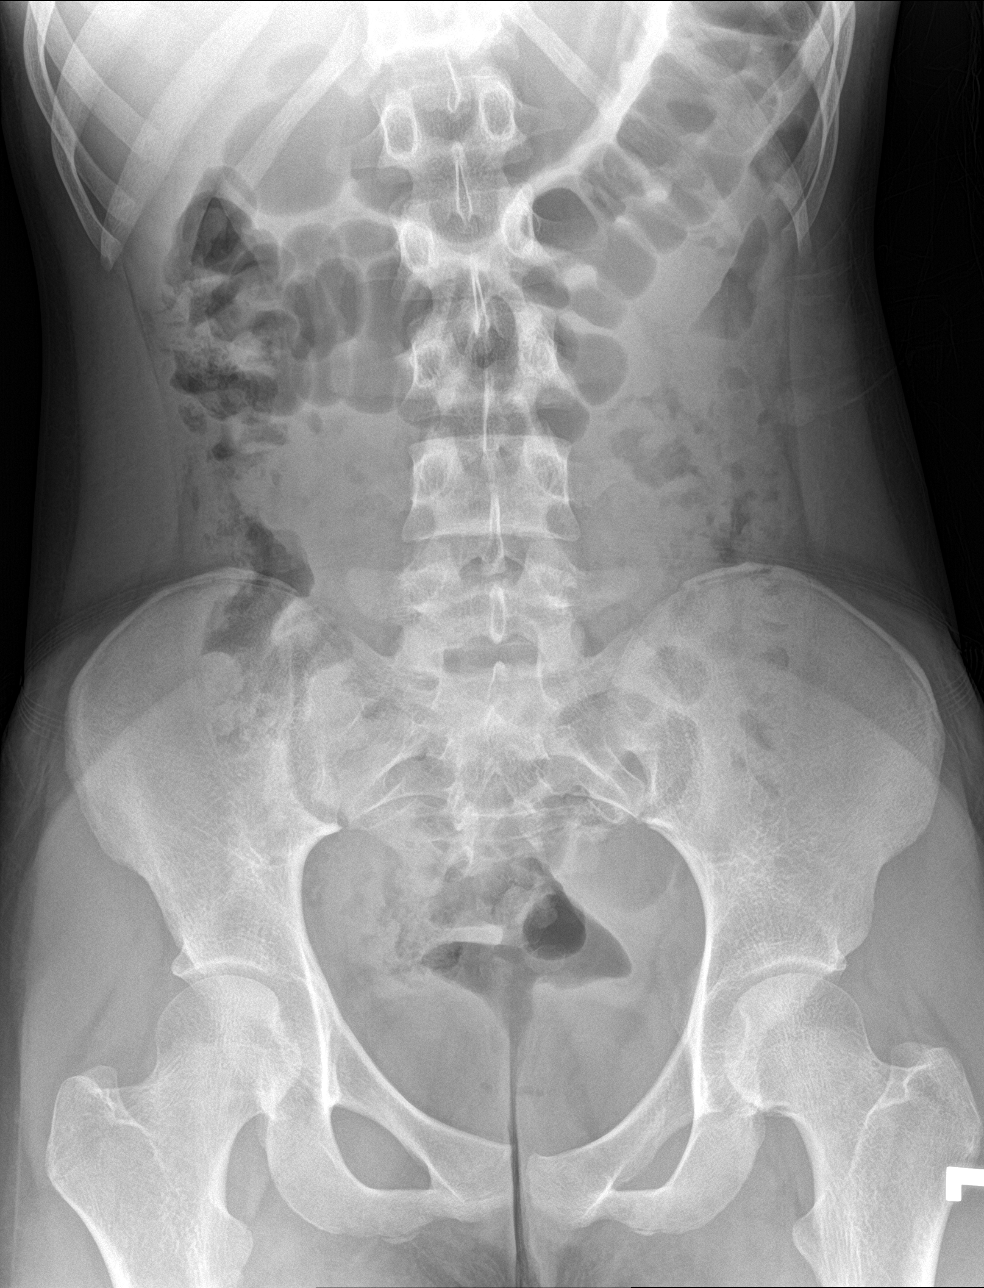

[3 of 3 positions shown; findings below may reference images not displayed]

FINDINGS: The cardiomediastinal contours are normal. The lungs are clear.
There is no free intra-abdominal air. No dilated bowel loops to
suggest obstruction. Small volume of stool throughout the colon. No
radiopaque calculi. No acute osseous abnormalities are seen. Trace
dextroscoliotic curvature of the thoracolumbar spine.
IMPRESSION: 1. Unremarkable radiographs of the chest and abdomen.
2. Mild thoracolumbar scoliosis.

## 2020-11-12 ENCOUNTER — Encounter (HOSPITAL_COMMUNITY): Payer: Self-pay | Admitting: *Deleted

## 2020-11-12 ENCOUNTER — Emergency Department (HOSPITAL_COMMUNITY)
Admission: EM | Admit: 2020-11-12 | Discharge: 2020-11-12 | Disposition: A | Discharge status: Home / Self Care | Payer: Medicaid Other | Attending: Emergency Medicine | Admitting: Emergency Medicine

## 2020-11-12 ENCOUNTER — Other Ambulatory Visit: Payer: Self-pay

## 2020-11-12 DIAGNOSIS — Z202 Contact with and (suspected) exposure to infections with a predominantly sexual mode of transmission: Secondary | ICD-10-CM | POA: Insufficient documentation

## 2020-11-12 NOTE — Discharge Instructions (Addendum)
I cannot emphasize enough the importance of getting established with a primary care provider for ongoing evaluation and management of your health and wellbeing.  You would benefit from preventive health screenings such as a Pap smear.  If you would like free STI testing, I recommend that you go to the [facility] department in [place]. Address: [address] [place], [place] [address] Hours:  Open ? Closes 5PM Phone: [phone]  Return to the ER or seek immediate medical attention should you experience any new or worsening symptoms.

## 2020-11-12 NOTE — ED Triage Notes (Signed)
Pt reports her previous sexual partner is concerned he has HSV and she would like testing as well. She reports he was tested at 3 different places and all told he was negative. She last had intercourse with him one year ago.

## 2020-11-12 NOTE — ED Provider Notes (Signed)
[facility]-EMERGENCY DEPT Provider Note   CSN: [id number] Arrival date & time: 11/12/20  1035     History Chief Complaint  Patient presents with  . Exposure to STD    Morgan Myers is a 25 y.o. female with no significant past medical history presents the ED with complaints of possible STI exposure.  On my examination, patient reports that she was in a sexual relationship with a single female partner.  She had intercourse with him last year and they are now beginning to talk again.  He states that he had a penile rash around the glans penis last year that was red, scaling.  He was tested for HSV and was found to be negative.  However, he is still concerned which prompted her to come to the ED for evaluation and checkup.  She denies any abdominal pain, dysuria, increased urinary frequency, vaginal discharge, rash, or other lesions.  She denies any symptoms at present.  She is simply concerned because he was concerned.  She is trying given reassurance over the phone on my examination.  I encouraged them both to follow-up with [facility] department for free STI testing.  She is in school and working.  She believes that she is on her parents medical insurance.  I emphasized the importance of getting established with a primary care provider for continuity of care and preventative health screenings.  HPI     Past Medical History:  Diagnosis Date  . Anemia     There are no problems to display for this patient.   History reviewed. No pertinent surgical history.   OB History   No obstetric history on file.     Family History  Problem Relation Age of Onset  . Heart failure Mother   . Hypertension Father     Social History   Tobacco Use  . Smoking status: Never Smoker  . Smokeless tobacco: Never Used  Vaping Use  . Vaping Use: Never used  Substance Use Topics  . Alcohol use: No  . Drug use: No    Home Medications Prior to Admission  medications   Medication Sig Start Date End Date Taking? Authorizing Provider  dicyclomine (BENTYL) 20 MG tablet Take 1 tablet (20 mg total) by mouth 2 (two) times daily as needed for spasms. 02/10/18   Gilda Crease, MD  ibuprofen (ADVIL,MOTRIN) 800 MG tablet Take 1 tablet (800 mg total) by mouth every 8 (eight) hours as needed for moderate pain. 02/10/18   Gilda Crease, MD    Allergies    Iron and Sulfa antibiotics  Review of Systems   Review of Systems  All other systems reviewed and are negative.   Physical Exam Updated Vital Signs BP 111/86 (BP Location: Right Arm)   Pulse (!) 105   Temp 98.6 F (37 C) (Oral)   Resp 15   Ht 5\' 4"  (1.626 m)   Wt 56.7 kg   LMP 10/13/2020   SpO2 99%   BMI 21.46 kg/m   Physical Exam Vitals and nursing note reviewed. Exam conducted with a chaperone present.  Constitutional:      Appearance: Normal appearance.  HENT:     Head: Normocephalic and atraumatic.  Eyes:     General: No scleral icterus.    Conjunctiva/sclera: Conjunctivae normal.  Cardiovascular:     Rate and Rhythm: Normal rate.     Pulses: Normal pulses.  Pulmonary:     Effort: Pulmonary effort is normal. No  respiratory distress.  Abdominal:     General: Abdomen is flat. There is no distension.     Palpations: Abdomen is soft.     Tenderness: There is no abdominal tenderness. There is no guarding.  Musculoskeletal:     Cervical back: Normal range of motion.  Skin:    General: Skin is dry.  Neurological:     Mental Status: She is alert.     GCS: GCS eye subscore is 4. GCS verbal subscore is 5. GCS motor subscore is 6.  Psychiatric:        Mood and Affect: Mood normal.        Behavior: Behavior normal.        Thought Content: Thought content normal.     ED Results / Procedures / Treatments   Labs (all labs ordered are listed, but only abnormal results are displayed) Labs Reviewed - No data to display  EKG None  Radiology No results  found.  Procedures Procedures   Medications Ordered in ED Medications - No data to display  ED Course  I have reviewed the triage vital signs and the nursing notes.  Pertinent labs & imaging results that were available during my care of the patient were reviewed by me and considered in my medical decision making (see chart for details).    MDM Rules/Calculators/A&P                          Morgan Myers was evaluated in Emergency Department on 11/12/2020 for the symptoms described in the history of present illness. She was evaluated in the context of the global COVID-19 pandemic, which necessitated consideration that the patient might be at risk for infection with the SARS-CoV-2 virus that causes COVID-19. Institutional protocols and algorithms that pertain to the evaluation of patients at risk for COVID-19 are in a state of rapid change based on information released by regulatory bodies including the CDC and federal and state organizations. These policies and algorithms were followed during the patient's care in the ED.  I personally reviewed patient's medical chart and all notes from triage and staff during today's encounter. I have also ordered and reviewed all labs and imaging that I felt to be medically necessary in the evaluation of this patient's complaints and with consideration of their physical exam. If needed, translation services were available and utilized.   Patient was concern for STI exposure, but her female partner had apparently tested negative and simply had a penile rash of unknown etiology.  She is completely asymptomatic at this time and without any ulcers or rash that I would be able to culture.  I informed her that given her lack of any symptoms, there is no work-up for you to obtain.  Instead, I emphasized the importance of safe sex and using condoms regularly.  Recommending that she get established with a primary care provider for her health screening exams including Pap  smear.  I will also provide her with information on the [facility] department to which she can go for free STI screening.  ED return precautions discussed.  Patient voices understanding and is agreeable to the plan.  Final Clinical Impression(s) / ED Diagnoses Final diagnoses:  Possible exposure to STD    Rx / DC Orders ED Discharge Orders    None       Elvera Maria 11/12/20 1129    Koleen Distance, MD 11/12/20 1555

## 2020-11-12 NOTE — ED Notes (Signed)
An After Visit Summary was printed and given to the patient. Discharge instructions given and no further questions at this time.  

## 2022-06-01 ENCOUNTER — Ambulatory Visit (HOSPITAL_COMMUNITY)
Admission: EM | Admit: 2022-06-01 | Discharge: 2022-06-01 | Disposition: A | Discharge status: Home / Self Care | Payer: Medicaid Other | Attending: Internal Medicine | Admitting: Internal Medicine

## 2022-06-01 ENCOUNTER — Encounter (HOSPITAL_COMMUNITY): Payer: Self-pay

## 2022-06-01 DIAGNOSIS — Z113 Encounter for screening for infections with a predominantly sexual mode of transmission: Secondary | ICD-10-CM

## 2022-06-01 DIAGNOSIS — N898 Other specified noninflammatory disorders of vagina: Secondary | ICD-10-CM | POA: Insufficient documentation

## 2022-06-01 NOTE — ED Triage Notes (Signed)
Chief Complaint: vaginal bleeding. Patient is allergic to sulfate. Vaginal irritation as well. Bleeding outside of her period. White discharge and itching.   Onset: 2 days ago   Sick exposure: No  New foods or medications: No  Recent Travel: Patient travelled to [place] a month ago.

## 2022-06-01 NOTE — ED Provider Notes (Signed)
[facility]    CSN: [id number] Arrival date & time: 06/01/22  1821      History   Chief Complaint Chief Complaint  Patient presents with   Vaginal Bleeding   Vaginal Discharge    HPI Morgan Myers is a 26 y.o. female.   Patient presents urgent care for evaluation of vaginal itching and white malodorous vaginal discharge that started a couple days ago.  She has been experiencing vaginal itching "for a long time" and has attempted use of cortisone cream to the vaginal area and attempt to calm down the itching without relief.  No recent changes in personal hygiene products or laundry detergents.  Denies rash and pain to the vaginal area.  No dyspareunia, but she does report a couple of episodes of vaginal bleeding outside of her menstrual cycle that happened 2 days ago.  She is sexually active with monogamous female partner and recently started using lubrication.  Denies chance of pregnancy.  No abdominal pain, nausea, vomiting, dizziness, URI symptoms, fever/chills, or urinary symptoms reported.  No recent new sexual partners.   Vaginal Bleeding Associated symptoms: vaginal discharge   Vaginal Discharge   Past Medical History:  Diagnosis Date   Anemia     There are no problems to display for this patient.   History reviewed. No pertinent surgical history.  OB History   No obstetric history on file.      Home Medications    Prior to Admission medications   Medication Sig Start Date End Date Taking? Authorizing Provider  dicyclomine (BENTYL) 20 MG tablet Take 1 tablet (20 mg total) by mouth 2 (two) times daily as needed for spasms. 02/10/18   Gilda Crease, MD  fluconazole (DIFLUCAN) 150 MG tablet Take 1 tablet (150 mg total) by mouth once for 1 dose. Repeat in 3 days if symptoms persist 06/03/22 06/03/22  Merrilee Jansky, MD  ibuprofen (ADVIL,MOTRIN) 800 MG tablet Take 1 tablet (800 mg total) by mouth every 8 (eight) hours as needed for moderate  pain. 02/10/18   Gilda Crease, MD  metroNIDAZOLE (FLAGYL) 500 MG tablet Take 1 tablet (500 mg total) by mouth 2 (two) times daily. 06/03/22   LampteyBritta Mccreedy, MD    Family History Family History  Problem Relation Age of Onset   Heart failure Mother    Hypertension Father     Social History Social History   Tobacco Use   Smoking status: Never   Smokeless tobacco: Never  Vaping Use   Vaping Use: Never used  Substance Use Topics   Alcohol use: No   Drug use: No     Allergies   Iron and Sulfa antibiotics   Review of Systems Review of Systems  Genitourinary:  Positive for vaginal bleeding and vaginal discharge.  Per HPI   Physical Exam Triage Vital Signs ED Triage Vitals  Enc Vitals Group     BP 06/01/22 1932 127/80     Pulse Rate 06/01/22 1932 84     Resp 06/01/22 1932 15     Temp 06/01/22 1932 97.7 F (36.5 C)     Temp Source 06/01/22 1932 Oral     SpO2 06/01/22 1932 100 %     Weight --      Height --      Head Circumference --      Peak Flow --      Pain Score 06/01/22 1920 4     Pain Loc --  Pain Edu? --      Excl. in GC? --    No data found.  Updated Vital Signs BP 127/80 (BP Location: Left Arm)   Pulse 84   Temp 97.7 F (36.5 C) (Oral)   Resp 15   LMP 05/18/2022 (Approximate)   SpO2 100%   Visual Acuity Right Eye Distance:   Left Eye Distance:   Bilateral Distance:    Right Eye Near:   Left Eye Near:    Bilateral Near:     Physical Exam Vitals and nursing note reviewed.  Constitutional:      Appearance: She is not ill-appearing or toxic-appearing.  HENT:     Head: Normocephalic and atraumatic.     Right Ear: Hearing and external ear normal.     Left Ear: Hearing and external ear normal.     Nose: Nose normal.     Mouth/Throat:     Lips: Pink.  Eyes:     General: Lids are normal. Vision grossly intact. Gaze aligned appropriately.     Extraocular Movements: Extraocular movements intact.     Conjunctiva/sclera:  Conjunctivae normal.  Pulmonary:     Effort: Pulmonary effort is normal.  Genitourinary:    Comments: Deferred. Musculoskeletal:     Cervical back: Neck supple.  Skin:    General: Skin is warm and dry.     Capillary Refill: Capillary refill takes less than 2 seconds.     Findings: No rash.  Neurological:     General: No focal deficit present.     Mental Status: She is alert and oriented to person, place, and time. Mental status is at baseline.     Cranial Nerves: No dysarthria or facial asymmetry.  Psychiatric:        Mood and Affect: Mood normal.        Speech: Speech normal.        Behavior: Behavior normal.        Thought Content: Thought content normal.        Judgment: Judgment normal.      UC Treatments / Results  Labs (all labs ordered are listed, but only abnormal results are displayed)  EKG   Radiology No results found.  Procedures Procedures (including critical care time)  Medications Ordered in UC Medications - No data to display  Initial Impression / Assessment and Plan / UC Course  I have reviewed the triage vital signs and the nursing notes.  Pertinent labs & imaging results that were available during my care of the patient were reviewed by me and considered in my medical decision making (see chart for details).   1.  Vaginal irritation and screen for STD STI labs pending.  Patient declines HIV and syphilis testing today.  Will notify patient of positive results and treat accordingly when labs come back.  Patient to avoid sexual intercourse until screening testing comes back.  Education provided regarding safe sexual practices and patient encouraged to use protection to prevent spread of STIs.   Advised patient to stop using cortisone cream to the vaginal area as this can cause skin thinning and irritation.  Vaginal bleeding episode is likely related to friction trauma from penetration without lubrication.  Advised patient to use lubricant during  intercourse to prevent this from happening in the future.  She may use Monistat over-the-counter yeast treatment for vaginal itching as directed in the future instead of using cortisone cream which will likely make symptoms worse.   Discussed physical exam and available  lab work findings in clinic with patient.  Counseled patient regarding appropriate use of medications and potential side effects for all medications recommended or prescribed today. Discussed red flag signs and symptoms of worsening condition,when to call the PCP office, return to urgent care, and when to seek higher level of care in the emergency department. Patient verbalizes understanding and agreement with plan. All questions answered. Patient discharged in stable condition.    Final Clinical Impressions(s) / UC Diagnoses   Final diagnoses:  Vaginal irritation  Screen for STD (sexually transmitted disease)     Discharge Instructions      Your STD testing has been sent to the lab and will come back in the next 2 to 3 days.  We will call you if any of your results are positive requiring treatment and treat you at that time.   Avoid sexual intercourse until your STD results come back.  If any of your STD results are positive, you will need to avoid sexual intercourse for 7 days while you are being treated to prevent spread of STD.  Condom use is the best way to prevent spread of STDs.  Return to urgent care as needed.      ED Prescriptions   None    PDMP not reviewed this encounter.   Carlisle Beers, [place] 06/03/22 1230

## 2022-06-01 NOTE — Discharge Instructions (Addendum)
Your STD testing has been sent to the lab and will come back in the next 2 to 3 days.  We will call you if any of your results are positive requiring treatment and treat you at that time.   Avoid sexual intercourse until your STD results come back.  If any of your STD results are positive, you will need to avoid sexual intercourse for 7 days while you are being treated to prevent spread of STD.  Condom use is the best way to prevent spread of STDs.  Return to urgent care as needed.  

## 2022-06-02 LAB — CERVICOVAGINAL ANCILLARY ONLY
Bacterial Vaginitis (gardnerella): POSITIVE — AB
Candida Glabrata: NEGATIVE
Candida Vaginitis: POSITIVE — AB
Chlamydia: NEGATIVE
Comment: NEGATIVE
Comment: NEGATIVE
Comment: NEGATIVE
Comment: NEGATIVE
Comment: NEGATIVE
Comment: NORMAL
Neisseria Gonorrhea: NEGATIVE
Trichomonas: POSITIVE — AB

## 2022-06-03 ENCOUNTER — Telehealth (HOSPITAL_COMMUNITY): Payer: Self-pay | Admitting: Emergency Medicine

## 2022-06-03 MED ORDER — METRONIDAZOLE 500 MG PO TABS: 500.0000 mg | ORAL_TABLET | Freq: Two times a day (BID) | ORAL | 0 refills | Status: DC

## 2022-06-03 MED ORDER — FLUCONAZOLE 150 MG PO TABS: 150.0000 mg | ORAL_TABLET | Freq: Once | ORAL | 0 refills | Status: AC

## 2022-10-02 ENCOUNTER — Ambulatory Visit (HOSPITAL_COMMUNITY): Payer: Medicaid Other

## 2023-08-05 ENCOUNTER — Emergency Department (HOSPITAL_COMMUNITY)
Admission: EM | Admit: 2023-08-05 | Discharge: 2023-08-05 | Disposition: A | Discharge status: Home / Self Care | Payer: Medicaid Other | Attending: Emergency Medicine | Admitting: Emergency Medicine

## 2023-08-05 ENCOUNTER — Other Ambulatory Visit: Payer: Self-pay

## 2023-08-05 DIAGNOSIS — D508 Other iron deficiency anemias: Secondary | ICD-10-CM | POA: Diagnosis not present

## 2023-08-05 DIAGNOSIS — E876 Hypokalemia: Secondary | ICD-10-CM | POA: Insufficient documentation

## 2023-08-05 DIAGNOSIS — F419 Anxiety disorder, unspecified: Secondary | ICD-10-CM | POA: Insufficient documentation

## 2023-08-05 DIAGNOSIS — R42 Dizziness and giddiness: Secondary | ICD-10-CM | POA: Diagnosis not present

## 2023-08-05 DIAGNOSIS — D509 Iron deficiency anemia, unspecified: Secondary | ICD-10-CM | POA: Diagnosis not present

## 2023-08-05 LAB — CBC WITH DIFFERENTIAL/PLATELET
Abs Immature Granulocytes: 0.01 10*3/uL (ref 0.00–0.07)
Basophils Absolute: 0 10*3/uL (ref 0.0–0.1)
Basophils Relative: 1 %
Eosinophils Absolute: 0 10*3/uL (ref 0.0–0.5)
Eosinophils Relative: 0 %
HCT: 28.2 % — ABNORMAL LOW (ref 36.0–46.0)
Hemoglobin: 7.7 g/dL — ABNORMAL LOW (ref 12.0–15.0)
Immature Granulocytes: 0 %
Lymphocytes Relative: 42 %
Lymphs Abs: 1.7 10*3/uL (ref 0.7–4.0)
MCH: 18.8 pg — ABNORMAL LOW (ref 26.0–34.0)
MCHC: 27.3 g/dL — ABNORMAL LOW (ref 30.0–36.0)
MCV: 68.9 fL — ABNORMAL LOW (ref 80.0–100.0)
Monocytes Absolute: 0.4 10*3/uL (ref 0.1–1.0)
Monocytes Relative: 9 %
Neutro Abs: 1.9 10*3/uL (ref 1.7–7.7)
Neutrophils Relative %: 48 %
Platelets: 300 10*3/uL (ref 150–400)
RBC: 4.09 MIL/uL (ref 3.87–5.11)
RDW: 18.6 % — ABNORMAL HIGH (ref 11.5–15.5)
WBC: 4 10*3/uL (ref 4.0–10.5)
nRBC: 0 % (ref 0.0–0.2)

## 2023-08-05 LAB — COMPREHENSIVE METABOLIC PANEL
ALT: 10 U/L (ref 0–44)
AST: 21 U/L (ref 15–41)
Albumin: 4.1 g/dL (ref 3.5–5.0)
Alkaline Phosphatase: 82 U/L (ref 38–126)
Anion gap: 8 (ref 5–15)
BUN: 11 mg/dL (ref 6–20)
CO2: 23 mmol/L (ref 22–32)
Calcium: 9.8 mg/dL (ref 8.9–10.3)
Chloride: 106 mmol/L (ref 98–111)
Creatinine, Ser: 0.87 mg/dL (ref 0.44–1.00)
GFR, Estimated: >60 mL/min (ref >60)
Glucose, Bld: 98 mg/dL (ref 70–99)
Potassium: 3.3 mmol/L — ABNORMAL LOW (ref 3.5–5.1)
Sodium: 137 mmol/L (ref 135–145)
Total Bilirubin: 1.1 mg/dL (ref 0.0–1.2)
Total Protein: 8.8 g/dL — ABNORMAL HIGH (ref 6.5–8.1)

## 2023-08-05 LAB — PREGNANCY, URINE: Preg Test, Ur: NEGATIVE

## 2023-08-05 NOTE — ED Triage Notes (Signed)
Pt BIBA from home for dizziness and anxiety, pt endorses feeling this way on her menstrual cycle and usually goes away but this time nothing is helping. Hx of iron deficiency, hx of anemia.

## 2023-08-05 NOTE — ED Provider Notes (Signed)
[place] EMERGENCY DEPARTMENT AT [facility] Provider Note  CSN: [id number] Arrival date & time: 08/05/23 0106  Chief Complaint(s) Dizziness and Anxiety  HPI Morgan Myers is a 28 y.o. female with a past medical history listed below including iron deficiency anemia and anxiety who presents to the emergency department for several days of lightheadedness and fogginess.  She reports this occurs just prior to her menstrual cycles and usually goes away within a day but this has been ongoing for couple days.  She does report feeling more lightheaded upon standing.  Denies any associated chest pain or shortness of breath.  No recent fevers or infections.  No cough or congestion.  No nausea vomiting.  No diarrhea.  Patient was last menstrual cycle was December 22.  Denies any sexual activity with males.  No urinary symptoms.  She does endorse intermittent pelvic cramping but denies any current pain at this time.  Reports that feeling the symptoms and extreme more anxious but is able to control it with therapeutic breathing and CBT. She does feel more anxious since being in college her at [employer].  The history is provided by the patient.    Past Medical History Past Medical History:  Diagnosis Date   Anemia    There are no active problems to display for this patient.  Home Medication(s) Prior to Admission medications   Medication Sig Start Date End Date Taking? Authorizing Provider  dicyclomine (BENTYL) 20 MG tablet Take 1 tablet (20 mg total) by mouth 2 (two) times daily as needed for spasms. 02/10/18   Gilda Crease, MD  ibuprofen (ADVIL,MOTRIN) 800 MG tablet Take 1 tablet (800 mg total) by mouth every 8 (eight) hours as needed for moderate pain. 02/10/18   Gilda Crease, MD  metroNIDAZOLE (FLAGYL) 500 MG tablet Take 1 tablet (500 mg total) by mouth 2 (two) times daily. 06/03/22   LampteyBritta Mccreedy, MD                                                                                                                                     Allergies Iron and Sulfa antibiotics  Review of Systems Review of Systems As noted in HPI  Physical Exam Vital Signs  I have reviewed the triage vital signs BP 115/88 (BP Location: Right Arm)   Pulse 79   Temp 98.1 F (36.7 C) (Oral)   Resp 16   LMP 07/04/2023 (Exact Date)   SpO2 100%   Physical Exam Vitals reviewed.  Constitutional:      General: She is not in acute distress.    Appearance: She is well-developed. She is not diaphoretic.  HENT:     Head: Normocephalic and atraumatic.     Right Ear: External ear normal.     Left Ear: External ear normal.     Nose: Nose normal.  Eyes:     General: No scleral icterus.  Right eye: No discharge.        Left eye: No discharge.     Conjunctiva/sclera: Conjunctivae normal.     Pupils: Pupils are equal, round, and reactive to light.  Neck:     Trachea: Phonation normal.  Cardiovascular:     Rate and Rhythm: Normal rate and regular rhythm.     Heart sounds: No murmur heard.    No friction rub. No gallop.  Pulmonary:     Effort: Pulmonary effort is normal. No respiratory distress.     Breath sounds: Normal breath sounds. No stridor. No rales.  Abdominal:     General: There is no distension.     Palpations: Abdomen is soft.     Tenderness: There is no abdominal tenderness.  Musculoskeletal:        General: No tenderness. Normal range of motion.     Cervical back: Normal range of motion and neck supple.  Skin:    General: Skin is warm and dry.     Findings: No erythema or rash.  Neurological:     Mental Status: She is alert and oriented to person, place, and time.  Psychiatric:        Behavior: Behavior normal.     ED Results and Treatments Labs (all labs ordered are listed, but only abnormal results are displayed) Labs Reviewed  CBC WITH DIFFERENTIAL/PLATELET - Abnormal; Notable for the following components:      Result Value   Hemoglobin 7.7 (*)     HCT 28.2 (*)    MCV 68.9 (*)    MCH 18.8 (*)    MCHC 27.3 (*)    RDW 18.6 (*)    All other components within normal limits  COMPREHENSIVE METABOLIC PANEL - Abnormal; Notable for the following components:   Potassium 3.3 (*)    Total Protein 8.8 (*)    All other components within normal limits  PREGNANCY, URINE                                                                                                                         EKG  EKG Interpretation Date/Time:  Thursday August 05 2023 01:49:19 EST Ventricular Rate:  76 PR Interval:  131 QRS Duration:  75 QT Interval:  373 QTC Calculation: 420 R Axis:   68  Text Interpretation: Sinus rhythm RSR' in V1 or V2, probably normal variant Nonspecific T abnormalities, anterior leads Otherwise no significant change Confirmed by Drema Pry [phone]) on 08/05/2023 2:39:27 AM       Radiology No results found.  Medications Ordered in ED Medications - No data to display Procedures Procedures  (including critical care time) Medical Decision Making / ED Course   Medical Decision Making Amount and/or Complexity of Data Reviewed Labs: ordered. Decision-making details documented in ED Course.    Lightheadedness. DDX and W/U noted below.  EKG without acute ischemic changes, dysrhythmias, blocks, severe bradycardia. UPT negative. CBC with ~ baseline Hb 7.7. no need for  transfusion. Encouraged her to restart iron supp. CMP with mild hypokalemia.  No other significant electrolyte derangements or renal sufficiency    Final Clinical Impression(s) / ED Diagnoses Final diagnoses:  Lightheadedness  Anxiety  Other iron deficiency anemia   The patient appears reasonably screened and/or stabilized for discharge and I doubt any other medical condition or other [facility] requiring further screening, evaluation, or treatment in the ED at this time. I have discussed the findings, Dx and Tx plan with the patient/family who expressed  understanding and agree(s) with the plan. Discharge instructions discussed at length. The patient/family was given strict return precautions who verbalized understanding of the instructions. No further questions at time of discharge.  Disposition: Discharge  Condition: Good  ED Discharge Orders     None       Follow Up: Primary care provider  Call  to schedule an appointment for close follow up    This chart was dictated using voice recognition software.  Despite best efforts to proofread,  errors can occur which can change the documentation meaning.    Nira Conn, MD 08/05/23 [id number]

## 2023-09-05 ENCOUNTER — Emergency Department (HOSPITAL_COMMUNITY): Payer: Medicaid Other

## 2023-09-05 ENCOUNTER — Emergency Department (HOSPITAL_COMMUNITY)
Admission: EM | Admit: 2023-09-05 | Discharge: 2023-09-05 | Disposition: A | Discharge status: Home / Self Care | Payer: Medicaid Other | Attending: Emergency Medicine | Admitting: Emergency Medicine

## 2023-09-05 ENCOUNTER — Other Ambulatory Visit: Payer: Self-pay

## 2023-09-05 DIAGNOSIS — R0602 Shortness of breath: Secondary | ICD-10-CM

## 2023-09-05 DIAGNOSIS — D649 Anemia, unspecified: Secondary | ICD-10-CM | POA: Insufficient documentation

## 2023-09-05 DIAGNOSIS — F419 Anxiety disorder, unspecified: Secondary | ICD-10-CM | POA: Insufficient documentation

## 2023-09-05 LAB — BASIC METABOLIC PANEL
Anion gap: 11 (ref 5–15)
BUN: 11 mg/dL (ref 6–20)
CO2: 20 mmol/L — ABNORMAL LOW (ref 22–32)
Calcium: 9.5 mg/dL (ref 8.9–10.3)
Chloride: 107 mmol/L (ref 98–111)
Creatinine, Ser: 0.68 mg/dL (ref 0.44–1.00)
GFR, Estimated: >60 mL/min (ref >60)
Glucose, Bld: 94 mg/dL (ref 70–99)
Potassium: 3.3 mmol/L — ABNORMAL LOW (ref 3.5–5.1)
Sodium: 138 mmol/L (ref 135–145)

## 2023-09-05 LAB — CBC WITH DIFFERENTIAL/PLATELET
Abs Immature Granulocytes: 0.01 10*3/uL (ref 0.00–0.07)
Basophils Absolute: 0 10*3/uL (ref 0.0–0.1)
Basophils Relative: 1 %
Eosinophils Absolute: 0 10*3/uL (ref 0.0–0.5)
Eosinophils Relative: 0 %
HCT: 27.7 % — ABNORMAL LOW (ref 36.0–46.0)
Hemoglobin: 7.6 g/dL — ABNORMAL LOW (ref 12.0–15.0)
Immature Granulocytes: 0 %
Lymphocytes Relative: 61 %
Lymphs Abs: 2.8 10*3/uL (ref 0.7–4.0)
MCH: 18.6 pg — ABNORMAL LOW (ref 26.0–34.0)
MCHC: 27.4 g/dL — ABNORMAL LOW (ref 30.0–36.0)
MCV: 67.9 fL — ABNORMAL LOW (ref 80.0–100.0)
Monocytes Absolute: 0.3 10*3/uL (ref 0.1–1.0)
Monocytes Relative: 7 %
Neutro Abs: 1.4 10*3/uL — ABNORMAL LOW (ref 1.7–7.7)
Neutrophils Relative %: 31 %
Platelets: 269 10*3/uL (ref 150–400)
RBC: 4.08 MIL/uL (ref 3.87–5.11)
RDW: 18.1 % — ABNORMAL HIGH (ref 11.5–15.5)
WBC: 4.6 10*3/uL (ref 4.0–10.5)
nRBC: 0 % (ref 0.0–0.2)

## 2023-09-05 LAB — RESP PANEL BY RT-PCR (RSV, FLU A&B, COVID)  RVPGX2
Influenza A by PCR: NEGATIVE
Influenza B by PCR: NEGATIVE
Resp Syncytial Virus by PCR: NEGATIVE
SARS Coronavirus 2 by RT PCR: NEGATIVE

## 2023-09-05 LAB — HCG, QUANTITATIVE, PREGNANCY: hCG, Beta Chain, Quant, S: <1 m[IU]/mL (ref <5)

## 2023-09-05 LAB — D-DIMER, QUANTITATIVE: D-Dimer, Quant: 0.59 ug{FEU}/mL — ABNORMAL HIGH (ref 0.00–0.50)

## 2023-09-05 MED ORDER — POLYSACCHARIDE IRON COMPLEX 150 MG PO CAPS: 150.0000 mg | ORAL_CAPSULE | Freq: Every day | ORAL | 1 refills | Status: DC

## 2023-09-05 MED ORDER — IOHEXOL 350 MG/ML SOLN
70.0000 mL | Freq: Once | INTRAVENOUS | Status: AC | PRN
  Administered 2023-09-05: 70 mL via INTRAVENOUS

## 2023-09-05 MED ORDER — ALBUTEROL SULFATE HFA 108 (90 BASE) MCG/ACT IN AERS
2.0000 | INHALATION_SPRAY | RESPIRATORY_TRACT | Status: DC | PRN
  Administered 2023-09-05: 2 via RESPIRATORY_TRACT
  Filled 2023-09-05: qty 6.7

## 2023-09-05 NOTE — ED Provider Notes (Signed)
  EMERGENCY DEPARTMENT AT [facility] Provider Note   CSN: [id number] Arrival date & time: 09/05/23  4098     History  Chief Complaint  Patient presents with   Shortness of Breath   Abscess   Anxiety    Morgan Myers is a 28 y.o. female.  Patient is a 28 year old who presents with shortness of breath.  She said that she recently traveled to [place].  Since she got back she has noticed little bit of shortness of breath.  She has a little bit of coughing.  No fevers.  No chest pain.  She feels like she has asthma but has not been diagnosed.  She will use a family member's inhaler from time to time.  She denies any leg pain or calf tenderness.  No leg swelling.  She does report that she is anemic and has been out of her iron supplementation.  She had to get a special iron supplementation given her sulfate allergy.  She also reports for several months she has had a small pinpoint area in her right temple area just over the ear that intermittently will drain.       Home Medications Prior to Admission medications   Medication Sig Start Date End Date Taking? Authorizing Provider  iron polysaccharides (NIFEREX) 150 MG capsule Take 1 capsule (150 mg total) by mouth daily. 09/05/23  Yes Rolan Bucco, MD  dicyclomine (BENTYL) 20 MG tablet Take 1 tablet (20 mg total) by mouth 2 (two) times daily as needed for spasms. 02/10/18   Gilda Crease, MD  ibuprofen (ADVIL,MOTRIN) 800 MG tablet Take 1 tablet (800 mg total) by mouth every 8 (eight) hours as needed for moderate pain. 02/10/18   Gilda Crease, MD  metroNIDAZOLE (FLAGYL) 500 MG tablet Take 1 tablet (500 mg total) by mouth 2 (two) times daily. 06/03/22   Merrilee Jansky, MD      Allergies    Iron and Sulfa antibiotics    Review of Systems   Review of Systems  Constitutional:  Negative for chills, diaphoresis, fatigue and fever.  HENT:  Negative for congestion, rhinorrhea and sneezing.   Eyes:  Negative.   Respiratory:  Positive for cough and shortness of breath. Negative for chest tightness.   Cardiovascular:  Negative for chest pain and leg swelling.  Gastrointestinal:  Negative for abdominal pain, blood in stool, diarrhea, nausea and vomiting.  Genitourinary:  Negative for difficulty urinating, flank pain, frequency and hematuria.  Musculoskeletal:  Negative for arthralgias and back pain.  Skin:  Negative for rash.  Neurological:  Negative for dizziness, speech difficulty, weakness, numbness and headaches.    Physical Exam Updated Vital Signs BP 113/73   Pulse 75   Temp 98.3 F (36.8 C) (Oral)   Resp 15   Ht 5\' 4"  (1.626 m)   Wt 64.9 kg   LMP 09/05/2023   SpO2 100%   BMI 24.55 kg/m  Physical Exam Constitutional:      Appearance: She is well-developed.  HENT:     Head: Normocephalic and atraumatic.     Comments: Patient has a small pinpoint scab to the right temple area just anterior to the ear.  There is no drainage.  No redness.  No tenderness.  No fluctuance or induration. Eyes:     Pupils: Pupils are equal, round, and reactive to light.  Cardiovascular:     Rate and Rhythm: Normal rate and regular rhythm.     Heart sounds: Normal heart sounds.  Pulmonary:     Effort: Pulmonary effort is normal. No respiratory distress.     Breath sounds: Normal breath sounds. No wheezing or rales.  Chest:     Chest wall: No tenderness.  Abdominal:     General: Bowel sounds are normal.     Palpations: Abdomen is soft.     Tenderness: There is no abdominal tenderness. There is no guarding or rebound.  Musculoskeletal:        General: Normal range of motion.     Cervical back: Normal range of motion and neck supple.  Lymphadenopathy:     Cervical: No cervical adenopathy.  Skin:    General: Skin is warm and dry.     Findings: No rash.  Neurological:     Mental Status: She is alert and oriented to person, place, and time.     ED Results / Procedures / Treatments    Labs (all labs ordered are listed, but only abnormal results are displayed) Labs Reviewed  BASIC METABOLIC PANEL - Abnormal; Notable for the following components:      Result Value   Potassium 3.3 (*)    CO2 20 (*)    All other components within normal limits  CBC WITH DIFFERENTIAL/PLATELET - Abnormal; Notable for the following components:   Hemoglobin 7.6 (*)    HCT 27.7 (*)    MCV 67.9 (*)    MCH 18.6 (*)    MCHC 27.4 (*)    RDW 18.1 (*)    Neutro Abs 1.4 (*)    All other components within normal limits  D-DIMER, QUANTITATIVE - Abnormal; Notable for the following components:   D-Dimer, Quant 0.59 (*)    All other components within normal limits  RESP PANEL BY RT-PCR (RSV, FLU A&B, COVID)  [id number]  HCG, QUANTITATIVE, PREGNANCY    EKG EKG Interpretation Date/Time:  Sunday September 05 2023 08:36:35 EST Ventricular Rate:  86 PR Interval:  126 QRS Duration:  82 QT Interval:  361 QTC Calculation: 432 R Axis:   78  Text Interpretation: Sinus rhythm Confirmed by Rolan Bucco [phone]) on 09/05/2023 9:23:44 AM  Radiology CT Angio Chest PE W/Cm &/Or Wo Cm Result Date: 09/05/2023 CLINICAL DATA:  Shortness of breath after recent travel EXAM: CT ANGIOGRAPHY CHEST WITH CONTRAST TECHNIQUE: Multidetector CT imaging of the chest was performed using the standard protocol during bolus administration of intravenous contrast. Multiplanar CT image reconstructions and MIPs were obtained to evaluate the vascular anatomy. RADIATION DOSE REDUCTION: This exam was performed according to the departmental dose-optimization program which includes automated exposure control, adjustment of the mA and/or kV according to patient size and/or use of iterative reconstruction technique. CONTRAST:  70mL OMNIPAQUE IOHEXOL 350 MG/ML SOLN COMPARISON:  Same day chest radiograph FINDINGS: Decreased sensitivity and specificity for detailed findings due to motion artifact. Cardiovascular: The study is satisfactory for the  evaluation of pulmonary embolism. There are no filling defects in the central, lobar, segmental or subsegmental pulmonary artery branches to suggest acute pulmonary embolism. Great vessels are normal in course and caliber. Normal heart size. No significant pericardial fluid/thickening. Mediastinum/Nodes: Imaged thyroid gland without nodules meeting criteria for imaging follow-up by size. Normal esophagus. No pathologically enlarged axillary, supraclavicular, mediastinal, or hilar lymph nodes. Lungs/Pleura: The central airways are patent. No focal consolidation. No pneumothorax. No pleural effusion. Upper abdomen: Normal. Musculoskeletal: No acute or abnormal lytic or blastic osseous lesions. Review of the MIP images confirms the above findings. IMPRESSION: No evidence of pulmonary embolism or other acute  intrathoracic process. Electronically Signed   By: Agustin Cree M.D.   On: 09/05/2023 10:32   DG Chest Port 1 View Result Date: 09/05/2023 CLINICAL DATA:  28 year old female with shortness of breath, recent travel. EXAM: PORTABLE CHEST 1 VIEW COMPARISON:  Chest radiographs 02/10/2018 and earlier. FINDINGS: Portable AP semi upright view at 0840 hours. Normal lung volumes and mediastinal contours. Visualized tracheal air column is within normal limits. Allowing for portable technique the lungs are clear. Mild thoracic scoliosis. No acute osseous abnormality identified. Negative visible bowel gas. IMPRESSION: No cardiopulmonary abnormality.  Mild scoliosis. Electronically Signed   By: Odessa Fleming M.D.   On: 09/05/2023 09:11    Procedures Procedures    Medications Ordered in ED Medications  albuterol (VENTOLIN HFA) 108 (90 Base) MCG/ACT inhaler 2 puff (2 puffs Inhalation Given 09/05/23 1022)  iohexol (OMNIPAQUE) 350 MG/ML injection 70 mL (70 mLs Intravenous Contrast Given 09/05/23 1012)    ED Course/ Medical Decision Making/ A&P                                 Medical Decision Making Amount and/or Complexity  of Data Reviewed Labs: ordered. Radiology: ordered.  Risk OTC drugs. Prescription drug management.   Patient presents with shortness of breath.  She is not acutely short of breath currently.  She does not have any hypoxia.  No tachypnea or increased work of breathing.  Her lungs are clear on exam.  I do not hear any wheezing that would be more consistent with asthma.  Chest x-ray was performed which was interpreted by me and confirmed by the radiologist.  There is no evidence of pneumonia.  No pulmonary edema or other evidence of CHF.  Her D-dimer was elevated.  CT scan was performed which shows no evidence of PE.  Her other labs show anemia.  Her chart reviewed and her hemoglobin appears to be similar to prior values.  Will start her back on iron supplementation.  She has a small pinpoint scab adjacent to her ear.  Looks like maybe it was a cyst.  I do not see any active signs of infection.  He was discharged home in good condition.  Was encouraged to follow-up with her primary care doctor.  Return precautions were given.  Final Clinical Impression(s) / ED Diagnoses Final diagnoses:  Shortness of breath  Anemia, unspecified type    Rx / DC Orders ED Discharge Orders          Ordered    iron polysaccharides (NIFEREX) 150 MG capsule  Daily        09/05/23 1302              Rolan Bucco, MD 09/05/23 1339

## 2023-09-05 NOTE — Discharge Instructions (Addendum)
Follow-up with your primary care doctor.  Return to the emergency room if you have any worsening symptoms

## 2023-09-05 NOTE — ED Triage Notes (Signed)
 Patient to ED by POV with c/o SOB. States she traveled to [place] and on return she has had increased SOB. Used an inhaler and breathing treatment with improvement. R side ear abscess with yellow drainage and increased anxiety.

## 2023-09-27 ENCOUNTER — Other Ambulatory Visit: Payer: Self-pay

## 2023-09-27 ENCOUNTER — Telehealth: Payer: Self-pay | Admitting: Emergency Medicine

## 2023-09-27 ENCOUNTER — Telehealth: Payer: Self-pay

## 2023-09-27 ENCOUNTER — Encounter (HOSPITAL_COMMUNITY): Payer: Self-pay

## 2023-09-27 ENCOUNTER — Emergency Department (HOSPITAL_COMMUNITY)
Admission: EM | Admit: 2023-09-27 | Discharge: 2023-09-27 | Disposition: A | Discharge status: Home / Self Care | Attending: Emergency Medicine | Admitting: Emergency Medicine

## 2023-09-27 ENCOUNTER — Other Ambulatory Visit (HOSPITAL_COMMUNITY): Payer: Self-pay

## 2023-09-27 DIAGNOSIS — D500 Iron deficiency anemia secondary to blood loss (chronic): Secondary | ICD-10-CM | POA: Diagnosis not present

## 2023-09-27 DIAGNOSIS — F5089 Other specified eating disorder: Secondary | ICD-10-CM

## 2023-09-27 DIAGNOSIS — F5083 Pica in adults: Secondary | ICD-10-CM | POA: Diagnosis not present

## 2023-09-27 DIAGNOSIS — A599 Trichomoniasis, unspecified: Secondary | ICD-10-CM

## 2023-09-27 DIAGNOSIS — R42 Dizziness and giddiness: Secondary | ICD-10-CM | POA: Diagnosis not present

## 2023-09-27 DIAGNOSIS — D509 Iron deficiency anemia, unspecified: Secondary | ICD-10-CM | POA: Diagnosis not present

## 2023-09-27 LAB — BASIC METABOLIC PANEL
Anion gap: 9 (ref 5–15)
BUN: 8 mg/dL (ref 6–20)
CO2: 22 mmol/L (ref 22–32)
Calcium: 9.9 mg/dL (ref 8.9–10.3)
Chloride: 105 mmol/L (ref 98–111)
Creatinine, Ser: 0.87 mg/dL (ref 0.44–1.00)
GFR, Estimated: >60 mL/min (ref >60)
Glucose, Bld: 97 mg/dL (ref 70–99)
Potassium: 3.4 mmol/L — ABNORMAL LOW (ref 3.5–5.1)
Sodium: 136 mmol/L (ref 135–145)

## 2023-09-27 LAB — RETICULOCYTES
Immature Retic Fract: 23.8 % — ABNORMAL HIGH (ref 2.3–15.9)
RBC.: 4.2 MIL/uL (ref 3.87–5.11)
Retic Count, Absolute: 47.9 10*3/uL (ref 19.0–186.0)
Retic Ct Pct: 1.1 % (ref 0.4–3.1)

## 2023-09-27 LAB — CBC
HCT: 29 % — ABNORMAL LOW (ref 36.0–46.0)
Hemoglobin: 7.8 g/dL — ABNORMAL LOW (ref 12.0–15.0)
MCH: 18.2 pg — ABNORMAL LOW (ref 26.0–34.0)
MCHC: 26.9 g/dL — ABNORMAL LOW (ref 30.0–36.0)
MCV: 67.6 fL — ABNORMAL LOW (ref 80.0–100.0)
Platelets: 258 10*3/uL (ref 150–400)
RBC: 4.29 MIL/uL (ref 3.87–5.11)
RDW: 18.5 % — ABNORMAL HIGH (ref 11.5–15.5)
WBC: 5.9 10*3/uL (ref 4.0–10.5)
nRBC: 0 % (ref 0.0–0.2)

## 2023-09-27 LAB — IRON AND TIBC
Iron: 18 ug/dL — ABNORMAL LOW (ref 28–170)
Saturation Ratios: 3 % — ABNORMAL LOW (ref 10.4–31.8)
TIBC: 522 ug/dL — ABNORMAL HIGH (ref 250–450)
UIBC: 504 ug/dL

## 2023-09-27 LAB — FERRITIN: Ferritin: 3 ng/mL — ABNORMAL LOW (ref 11–307)

## 2023-09-27 LAB — HCG, SERUM, QUALITATIVE: Preg, Serum: NEGATIVE

## 2023-09-27 LAB — FOLATE: Folate: 12.5 ng/mL (ref >5.9)

## 2023-09-27 LAB — CBG MONITORING, ED: Glucose-Capillary: 103 mg/dL — ABNORMAL HIGH (ref 70–99)

## 2023-09-27 LAB — VITAMIN B12: Vitamin B-12: 233 pg/mL (ref 180–914)

## 2023-09-27 MED ORDER — IBUPROFEN 600 MG PO TABS: 600.0000 mg | ORAL_TABLET | Freq: Three times a day (TID) | ORAL | 0 refills | Status: DC | PRN

## 2023-09-27 MED ORDER — IRON SUCROSE 200 MG IVPB - SIMPLE MED
200.0000 mg | Freq: Once | Status: AC
  Administered 2023-09-27: 200 mg via INTRAVENOUS
  Filled 2023-09-27: qty 200

## 2023-09-27 MED ORDER — FLUCONAZOLE 150 MG PO TABS
150.0000 mg | ORAL_TABLET | ORAL | 0 refills | Status: DC | PRN
  Filled 2023-09-27: qty 2, 6d supply, fill #0

## 2023-09-27 MED ORDER — FLUCONAZOLE 150 MG PO TABS
150.0000 mg | ORAL_TABLET | Freq: Once | ORAL | 0 refills | Status: DC | PRN
  Filled 2023-09-27 – 2023-10-09 (×2): qty 1, 1d supply, fill #0

## 2023-09-27 MED ORDER — LACTATED RINGERS IV BOLUS
1000.0000 mL | Freq: Once | INTRAVENOUS | Status: AC
  Administered 2023-09-27: 1000 mL via INTRAVENOUS

## 2023-09-27 MED ORDER — POLYSACCHARIDE IRON COMPLEX 150 MG PO CAPS
150.0000 mg | ORAL_CAPSULE | Freq: Every day | ORAL | 1 refills | Status: AC
  Filled 2023-09-27 – 2023-10-09 (×3): qty 30, 30d supply, fill #0
  Filled 2023-11-03: qty 30, 30d supply, fill #1

## 2023-09-27 MED ORDER — METRONIDAZOLE 500 MG PO TABS
500.0000 mg | ORAL_TABLET | Freq: Two times a day (BID) | ORAL | 0 refills | Status: DC
  Filled 2023-09-27 – 2023-10-08 (×2): qty 14, 7d supply, fill #0

## 2023-09-27 NOTE — Telephone Encounter (Signed)
 Patient referred to infusion pharmacy team for ambulatory infusion of IV iron.  Insurance - [place] [employer] Prepaid  Dx code - D50.0 IV Iron Therapy -  Feraheme 510 mg IV x 2  Infusion appointments - Scheduling team will schedule patient as soon as possible.   Demetrius Charity, PharmD

## 2023-09-27 NOTE — Addendum Note (Signed)
 Addended by: Ihor Austin D on: 09/27/2023 12:05 PM   Modules accepted: Orders

## 2023-09-27 NOTE — ED Triage Notes (Signed)
 Pt. Arrives POV c/o lightheadedness. States that she is getting over a cold, so she took 2000 mg of theraflu instead of 1000mg . States that she feels like she is living in a dream. This only happens when she is on her cycle and she is not currently on her cycle.

## 2023-09-27 NOTE — Telephone Encounter (Signed)
 Auth Submission: NO AUTH NEEDED Site of care: Site of care: [facility] Payer: [place] [employer] Medication & CPT/J Code(s) submitted: Feraheme (ferumoxytol) [id number] Route of submission (phone, fax, portal):  Phone # Fax # Auth type: Buy/Bill PB Units/visits requested: 510mg  x 2 doses Reference number:  Approval from: 09/27/23 to 03/29/24

## 2023-09-27 NOTE — ED Provider Notes (Signed)
 [place] EMERGENCY DEPARTMENT AT [facility] Provider Note   CSN: [id number] Arrival date & time: 09/27/23  0533     History  Chief Complaint  Patient presents with   Dizziness    Morgan Myers is a 28 y.o. female.  HPI    28 year old female comes in with chief complaint of dizziness. Patient has known history of iron deficiency anemia.  The root cause for anemia is chronic blood loss because of menorrhagia.  Patient also reports that she has had some persistent vaginal discharge, and that she did not finish her Flagyl prescription because she lost it.  Patient currently feels dizzy.  She states that she had some URI-like symptoms.  She took over-the-counter medications and since then has been feeling foggy, dizzy when she gets up and feels weak.  She did double up on the over-the-counter flu medication, but her last dose was now more than 24 hours ago.  Given that her symptoms have not resolved, she is concerned that she might have worsening anemia or dehydration.   Patient also states that she has had residual vaginal discharge from ER visit from few weeks ago.  She was given Flagyl prescription that she lost.  She continues to have foul-smelling vaginal discharge.  She is currently not sexually active with any men.  She denies any abdominal/pelvic pain.  Home Medications Prior to Admission medications   Medication Sig Start Date End Date Taking? Authorizing Provider  dicyclomine (BENTYL) 20 MG tablet Take 1 tablet (20 mg total) by mouth 2 (two) times daily as needed for spasms. 02/10/18   Gilda Crease, MD  fluconazole (DIFLUCAN) 150 MG tablet Take 1 tablet (150 mg total) by mouth once as needed for up to 1 dose (Vaginal itching). 09/27/23   Derwood Kaplan, MD  ibuprofen (ADVIL) 600 MG tablet Take 1 tablet (600 mg total) by mouth every 8 (eight) hours as needed for moderate pain (pain score 4-6). 09/27/23   Derwood Kaplan, MD  iron polysaccharides (NIFEREX) 150  MG capsule Take 1 capsule (150 mg total) by mouth daily. 09/27/23   Derwood Kaplan, MD  metroNIDAZOLE (FLAGYL) 500 MG tablet Take 1 tablet (500 mg total) by mouth 2 (two) times daily. 09/27/23   Derwood Kaplan, MD      Allergies    Iron and Sulfa antibiotics    Review of Systems   Review of Systems  All other systems reviewed and are negative.   Physical Exam Updated Vital Signs BP 119/85   Pulse 85   Temp 98.1 F (36.7 C) (Oral)   Resp 15   Ht 5\' 4"  (1.626 m)   Wt 66.7 kg   LMP 09/05/2023   SpO2 100%   BMI 25.23 kg/m  Physical Exam Vitals and nursing note reviewed.  Constitutional:      Appearance: She is well-developed.  HENT:     Head: Atraumatic.  Eyes:     Extraocular Movements: Extraocular movements intact.     Pupils: Pupils are equal, round, and reactive to light.  Cardiovascular:     Rate and Rhythm: Normal rate.  Pulmonary:     Effort: Pulmonary effort is normal.  Musculoskeletal:     Cervical back: Normal range of motion and neck supple.  Skin:    General: Skin is warm and dry.  Neurological:     Mental Status: She is alert and oriented to person, place, and time.     ED Results / Procedures / Treatments   Labs (  all labs ordered are listed, but only abnormal results are displayed) Labs Reviewed  BASIC METABOLIC PANEL - Abnormal; Notable for the following components:      Result Value   Potassium 3.4 (*)    All other components within normal limits  CBC - Abnormal; Notable for the following components:   Hemoglobin 7.8 (*)    HCT 29.0 (*)    MCV 67.6 (*)    MCH 18.2 (*)    MCHC 26.9 (*)    RDW 18.5 (*)    All other components within normal limits  IRON AND TIBC - Abnormal; Notable for the following components:   Iron 18 (*)    TIBC 522 (*)    Saturation Ratios 3 (*)    All other components within normal limits  FERRITIN - Abnormal; Notable for the following components:   Ferritin 3 (*)    All other components within normal limits   RETICULOCYTES - Abnormal; Notable for the following components:   Immature Retic Fract 23.8 (*)    All other components within normal limits  CBG MONITORING, ED - Abnormal; Notable for the following components:   Glucose-Capillary 103 (*)    All other components within normal limits  HCG, SERUM, QUALITATIVE  VITAMIN B12  FOLATE  URINALYSIS, ROUTINE W REFLEX MICROSCOPIC    EKG EKG Interpretation Date/Time:  Monday September 27 2023 05:49:57 EDT Ventricular Rate:  103 PR Interval:  107 QRS Duration:  80 QT Interval:  321 QTC Calculation: 421 R Axis:   83  Text Interpretation: Sinus tachycardia No acute changes normal intervals No significant change since last tracing Confirmed by Derwood Kaplan ([id number]) on 09/27/2023 8:09:03 AM  Radiology No results found.  Procedures .Critical Care  Performed by: Derwood Kaplan, MD Authorized by: Derwood Kaplan, MD   Critical care provider statement:    Critical care time (minutes):  31   Critical care was necessary to treat or prevent imminent or life-threatening deterioration of the following conditions:  Metabolic crisis (Symptomatic anemia, PICA -profound iron deficiency)   Critical care was time spent personally by me on the following activities:  Development of treatment plan with patient or surrogate, discussions with consultants, evaluation of patient's response to treatment, examination of patient, ordering and review of laboratory studies, ordering and review of radiographic studies, ordering and performing treatments and interventions, pulse oximetry, re-evaluation of patient's condition and review of old charts     Medications Ordered in ED Medications  lactated ringers bolus 1,000 mL (0 mLs Intravenous Stopped 09/27/23 0934)  iron sucrose (VENOFER) 200 mg in sodium chloride 0.9 % 100 mL IVPB (0 mg Intravenous Stopped 09/27/23 0948)    ED Course/ Medical Decision Making/ A&P                                 Medical Decision  Making Amount and/or Complexity of Data Reviewed Labs: ordered.  Risk OTC drugs. Prescription drug management.   28 year old female comes into the ER primarily for dizziness and feeling foggy/unable to focus well.  She has been feeling this way over the last 2 days that she is recovering from URI and had taken increased amount of over-the-counter flu medications then recommended.  On exam she has no nystagmus.  She is AOx3. She is nontoxic-appearing.  Vital signs are stable and within normal limits.  Differential diagnosis in her case includes symptomatic anemia, orthostatic hypotension, electrolyte abnormality.  Toxic effects  from the over-the-counter medications considered, but her last dose was now more than 24 hours ago, I doubt that it is the primary factor.  Upon further history, it appears that patient is also having pica. She has not been taking her iron supplements as pharmacy does not have it.  Root causes menorrhagia.  During the history, patient requested that we refill her iron medication to a different pharmacy.  When I was attempting to do that, she also requested I prescribe Flagyl, as she had lost that prescription.  She continues to have residual vaginal discharge that is foul-smelling.  She has no pelvic pain.  She is currently only seeing other women, not engage in any penetrative intercourse.  Given the pica and my belief that her symptoms are likely related to anemia, we will give her iron IV in the ER.  I did discuss transfusion, however hemoglobin is 7.8.  She states that her hemoglobin normally stays in this range and she is fine.  She would want to avoid transfusion if possible unless hemoglobin is less than 7.  Patient currently does not have any active bleeding.  She is supposed to follow-up with a gynecologist in few days.  I advised her that she has the gynecologist monitor her iron and if needed, consider referring her to hematologist.   Final Clinical  Impression(s) / ED Diagnoses Final diagnoses:  Pica  Iron deficiency anemia due to chronic blood loss  Trichomoniasis    Rx / DC Orders ED Discharge Orders          Ordered    iron polysaccharides (NIFEREX) 150 MG capsule  Daily        09/27/23 0726    metroNIDAZOLE (FLAGYL) 500 MG tablet  2 times daily        09/27/23 0733    ibuprofen (ADVIL) 600 MG tablet  Every 8 hours PRN        09/27/23 0734    Amb Referral to Intravenous Iron Therapy       Comments: You have been referred to [facility] Infusion team for IV Iron Infusions. The infusion pharmacy team will reach out to you with appointment information.    09/27/23 0813    fluconazole (DIFLUCAN) 150 MG tablet  Every 72 hours PRN,   Status:  Discontinued        09/27/23 1013    fluconazole (DIFLUCAN) 150 MG tablet  Once PRN        09/27/23 1014              Derwood Kaplan, MD 09/27/23 1048

## 2023-09-27 NOTE — Discharge Instructions (Addendum)
 Please follow-up with your primary care doctor for additional care. We have given you IV iron in the ER for your symptoms. Discussed with your gynecologist if you need hematologist follow-up. Also discussed with them ways to reduce your chronic blood loss.

## 2023-10-04 ENCOUNTER — Other Ambulatory Visit: Payer: Self-pay

## 2023-10-04 ENCOUNTER — Emergency Department (HOSPITAL_COMMUNITY)
Admission: EM | Admit: 2023-10-04 | Discharge: 2023-10-04 | Disposition: A | Discharge status: Home / Self Care | Attending: Emergency Medicine | Admitting: Emergency Medicine

## 2023-10-04 ENCOUNTER — Encounter (HOSPITAL_COMMUNITY): Payer: Self-pay

## 2023-10-04 DIAGNOSIS — K644 Residual hemorrhoidal skin tags: Secondary | ICD-10-CM | POA: Diagnosis not present

## 2023-10-04 DIAGNOSIS — R109 Unspecified abdominal pain: Secondary | ICD-10-CM

## 2023-10-04 DIAGNOSIS — R1031 Right lower quadrant pain: Secondary | ICD-10-CM | POA: Diagnosis not present

## 2023-10-04 DIAGNOSIS — R1030 Lower abdominal pain, unspecified: Secondary | ICD-10-CM | POA: Diagnosis present

## 2023-10-04 LAB — CBC WITH DIFFERENTIAL/PLATELET
Abs Immature Granulocytes: 0 10*3/uL (ref 0.00–0.07)
Basophils Absolute: 0 10*3/uL (ref 0.0–0.1)
Basophils Relative: 1 %
Eosinophils Absolute: 0 10*3/uL (ref 0.0–0.5)
Eosinophils Relative: 0 %
HCT: 27.8 % — ABNORMAL LOW (ref 36.0–46.0)
Hemoglobin: 7.6 g/dL — ABNORMAL LOW (ref 12.0–15.0)
Immature Granulocytes: 0 %
Lymphocytes Relative: 41 %
Lymphs Abs: 1.9 10*3/uL (ref 0.7–4.0)
MCH: 19.2 pg — ABNORMAL LOW (ref 26.0–34.0)
MCHC: 27.3 g/dL — ABNORMAL LOW (ref 30.0–36.0)
MCV: 70.2 fL — ABNORMAL LOW (ref 80.0–100.0)
Monocytes Absolute: 0.4 10*3/uL (ref 0.1–1.0)
Monocytes Relative: 8 %
Neutro Abs: 2.3 10*3/uL (ref 1.7–7.7)
Neutrophils Relative %: 50 %
Platelets: 286 10*3/uL (ref 150–400)
RBC: 3.96 MIL/uL (ref 3.87–5.11)
RDW: 21.6 % — ABNORMAL HIGH (ref 11.5–15.5)
WBC: 4.7 10*3/uL (ref 4.0–10.5)
nRBC: 0 % (ref 0.0–0.2)

## 2023-10-04 LAB — COMPREHENSIVE METABOLIC PANEL
ALT: 10 U/L (ref 0–44)
AST: 17 U/L (ref 15–41)
Albumin: 3.7 g/dL (ref 3.5–5.0)
Alkaline Phosphatase: 70 U/L (ref 38–126)
Anion gap: 6 (ref 5–15)
BUN: 13 mg/dL (ref 6–20)
CO2: 22 mmol/L (ref 22–32)
Calcium: 9.6 mg/dL (ref 8.9–10.3)
Chloride: 109 mmol/L (ref 98–111)
Creatinine, Ser: 0.87 mg/dL (ref 0.44–1.00)
GFR, Estimated: >60 mL/min (ref >60)
Glucose, Bld: 93 mg/dL (ref 70–99)
Potassium: 3.8 mmol/L (ref 3.5–5.1)
Sodium: 137 mmol/L (ref 135–145)
Total Bilirubin: 0.7 mg/dL (ref 0.0–1.2)
Total Protein: 7.7 g/dL (ref 6.5–8.1)

## 2023-10-04 LAB — LIPASE, BLOOD: Lipase: 28 U/L (ref 11–51)

## 2023-10-04 MED ORDER — SITZ BATH MISC: 1.0000 | 0 refills | Status: DC | PRN

## 2023-10-04 MED ORDER — HYDROCORTISONE (PERIANAL) 2.5 % EX CREA: 1.0000 | TOPICAL_CREAM | Freq: Two times a day (BID) | CUTANEOUS | 0 refills | Status: DC

## 2023-10-04 MED ORDER — IBUPROFEN 800 MG PO TABS
800.0000 mg | ORAL_TABLET | Freq: Once | ORAL | Status: AC
  Administered 2023-10-04: 800 mg via ORAL
  Filled 2023-10-04: qty 1

## 2023-10-04 NOTE — Discharge Instructions (Addendum)
 You were seen today for abdominal cramps for menstrual cycle and external hemorrhoid.  Recommend that you continue to use sitz bath with cold water and hydrocortisone cream.  This will help with both itching and pain.  Recommend he also take ibuprofen for continued relief of abdominal cramps and will also help with ankle pain.  If symptoms do not resolve within 1 week, recommend you follow-up with the primary care to schedule reevaluation and possible referral to general surgery for either excision or biopsy.  If you begin to develop any sort of worsening symptoms including fever, worsening abdominal pain, nausea, vomiting, shortness of breath, chest pain, return to the ED for further evaluation.

## 2023-10-04 NOTE — ED Provider Notes (Signed)
 [place] EMERGENCY DEPARTMENT AT [facility] Provider Note   CSN: [id number] Arrival date & time: 10/04/23  7829     History  Chief Complaint  Patient presents with   Abdominal Pain    Morgan Myers is a 28 y.o. female.   Abdominal Pain Patient is a 28 year old female presents the ED today with a 2-day history of abdominal cramps after starting her period and complaints of possible hemorrhoid where she noticed that she has had intermittent bloody stools on on toilet paper in the toilet after a bowel movement.  Previous medical history of iron deficiency anemia secondary to menorrhagia, scheduled to undergo iron infusion tomorrow and in 1 week, receiving iron infusion 5 days ago in the ED.  Has not taken ibuprofen at home due to "the ibuprofen that you give me in the ED works much better".  Reports that with the possible hemorrhoid she has been straining a lot more recently but also reports a low fiber intake.  Does not report being sexually active.  Denies fever, vision changes, chest pain, shortness of breath, dysuria, nausea, vomiting, diarrhea, melena, vaginal discharge.     Home Medications Prior to Admission medications   Medication Sig Start Date End Date Taking? Authorizing Provider  hydrocortisone (ANUSOL-HC) 2.5 % rectal cream Place 1 Application rectally 2 (two) times daily. 10/04/23  Yes Lunette Stands, PA-C  Misc. Devices (SITZ BATH) MISC 1 Device by Does not apply route as needed. 10/04/23  Yes Lunette Stands, PA-C  dicyclomine (BENTYL) 20 MG tablet Take 1 tablet (20 mg total) by mouth 2 (two) times daily as needed for spasms. 02/10/18   Gilda Crease, MD  fluconazole (DIFLUCAN) 150 MG tablet Take 1 tablet (150 mg total) by mouth once as needed for up to 1 dose (Vaginal itching). 09/27/23   Derwood Kaplan, MD  ibuprofen (ADVIL) 600 MG tablet Take 1 tablet (600 mg total) by mouth every 8 (eight) hours as needed for moderate pain (pain score 4-6).  09/27/23   Derwood Kaplan, MD  iron polysaccharides (NIFEREX) 150 MG capsule Take 1 capsule (150 mg total) by mouth daily. 09/27/23   Derwood Kaplan, MD  metroNIDAZOLE (FLAGYL) 500 MG tablet Take 1 tablet (500 mg total) by mouth 2 (two) times daily. 09/27/23   Derwood Kaplan, MD      Allergies    Iron and Sulfa antibiotics    Review of Systems   Review of Systems  Gastrointestinal:  Positive for abdominal pain and blood in stool.  All other systems reviewed and are negative.   Physical Exam Updated Vital Signs BP (!) 125/91 (BP Location: Left Arm)   Pulse 83   Temp 97.8 F (36.6 C) (Oral)   Resp 17   LMP 09/05/2023   SpO2 100%  Physical Exam Vitals and nursing note reviewed. Exam conducted with a chaperone present.  Constitutional:      General: She is not in acute distress.    Appearance: Normal appearance. She is not ill-appearing.  HENT:     Head: Normocephalic and atraumatic.  Eyes:     General: No scleral icterus.       Right eye: No discharge.        Left eye: No discharge.     Extraocular Movements: Extraocular movements intact.     Conjunctiva/sclera: Conjunctivae normal.  Cardiovascular:     Rate and Rhythm: Normal rate and regular rhythm.     Pulses: Normal pulses.     Heart sounds:  Normal heart sounds. No murmur heard.    No friction rub. No gallop.  Pulmonary:     Effort: Pulmonary effort is normal. No respiratory distress.     Breath sounds: Normal breath sounds. No stridor. No wheezing, rhonchi or rales.  Abdominal:     General: Abdomen is flat. There is no distension.     Palpations: Abdomen is soft. There is no pulsatile mass.     Tenderness: There is abdominal tenderness in the right lower quadrant, suprapubic area and left lower quadrant. There is no right CVA tenderness, left CVA tenderness, guarding or rebound. Negative signs include Murphy's sign, Rovsing's sign, McBurney's sign and psoas sign.  Genitourinary:    General: Normal vulva.     Exam  position: Knee-chest position.     Comments: Nurse present as chaperone.  Patient noted to have a nonthrombosed, skin colored lesion that is tender to palpation noted to the posterior lateral aspect of the anus.  No gross blood noted.  Area tender to touch. Musculoskeletal:        General: Normal range of motion.     Right lower leg: No edema.     Left lower leg: No edema.  Skin:    General: Skin is warm and dry.     Coloration: Skin is not jaundiced, mottled or pale.     Findings: No erythema.  Neurological:     General: No focal deficit present.     Mental Status: She is alert and oriented to person, place, and time. Mental status is at baseline.     Motor: No weakness.  Psychiatric:        Mood and Affect: Mood normal. Mood is not anxious.     ED Results / Procedures / Treatments   Labs (all labs ordered are listed, but only abnormal results are displayed) Labs Reviewed  CBC WITH DIFFERENTIAL/PLATELET - Abnormal; Notable for the following components:      Result Value   Hemoglobin 7.6 (*)    HCT 27.8 (*)    MCV 70.2 (*)    MCH 19.2 (*)    MCHC 27.3 (*)    RDW 21.6 (*)    All other components within normal limits  COMPREHENSIVE METABOLIC PANEL  LIPASE, BLOOD    EKG None  Radiology No results found.  Procedures Procedures    Medications Ordered in ED Medications  ibuprofen (ADVIL) tablet 800 mg (800 mg Oral Given 10/04/23 9562)    ED Course/ Medical Decision Making/ A&P                                 Medical Decision Making Amount and/or Complexity of Data Reviewed Labs: ordered.  Risk Prescription drug management.   This patient is a 28 year old female who presents to the ED for concern of lower abdominal cramps after starting period And possible hemorrhoid.  Patient notes that the abdominal cramps are similar to the ones during her menstrual cycle and came here today mainly because of wanting the ibuprofen that the ED can provide due to "working  better."  Also concerned about hemorrhoid which she said started after she has been straining more recently saying that she has not had much fiber and has had decreased fluid intake.  On physical exam, patient is on to be in no acute distress, afebrile, not hypoxic, speaking full sentences.  Noted to have a normal gait.  Noted to have some  mild generalized lower abdominal tenderness.  Patient also noted to have an external nonthrombosed hemorrhoid versus skin tag on the posterior lateral aspect of her anus.  No rashes, erythema, lesions noted otherwise.  Nursing staff present as chaperone.  Patient deferred pelvic exam and did not want imaging done.  Do not suspect skin tag at this time as patient symptoms most aligned with nonthrombosed, external hemorrhoid.  Will however we will have her follow-up with PCP in 1 week for reevaluation and possible referral to general surgery for biopsy and/or excision if required. Labs are at baseline for patient.  On reevaluation, patient is noted to say that cramping has improved with ibuprofen.  Low suspicion for any emergent pathology present at this time.  Will have her follow-up with PCP if symptoms do not resolve, for possible referral to general surgery if needed for excision or biopsy is required.  Will provide instructions on how to best handle hemorrhoids at home.  Patient vital signs remained stable to the course of her time here.  I believe this patient is a to be discharged at this time.  Differential diagnoses prior to evaluation: The emergent differential diagnosis includes, but is not limited to, cramps secondary to menstrual cycle, appendicitis, gastroenteritis, ovarian cyst, endometriosis, ovarian torsion, diverticulitis, UTI, kidney stone, anal tag, STI. This is not an exhaustive differential.   Past Medical History / Co-morbidities / Social History: Anemia secondary to chronic blood loss  Additional history: Chart reviewed. 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  Provided iron sucrose and lactated Ringer's noted to have an iron of 18 and a TIBC of 5.2 and a hemoglobin of 7.8 which was close to baseline.  Lab Tests/Imaging studies: I personally interpreted labs/imaging and the pertinent results include:   CBC notes anemia with a hemoglobin of 7.6 which is at baseline CMP unremarkable Lipase unremarkable Patient deferred on imaging as well as provide a urine today.  Medications: I ordered medication including ibuprofen.  I have reviewed the patients home medicines and have made adjustments as needed.  Disposition: After consideration of the diagnostic results and the patients response to treatment, I feel that the patient benefit from discharge and treatment as above.   emergency department workup does not suggest an emergent condition requiring admission or immediate intervention beyond what has been performed at this time. The plan is: Follow-up with PCP, management at home, follow-up with general surgery if pain worsens, return to the ED for any new or worsening symptoms with strict return to ED precautions provided. The patient is safe for discharge and has been instructed to return immediately for worsening symptoms, change in symptoms or any other concerns.  Final Clinical Impression(s) / ED Diagnoses Final diagnoses:  Abdominal cramps  External hemorrhoid    Rx / DC Orders ED Discharge Orders          Ordered    hydrocortisone (ANUSOL-HC) 2.5 % rectal cream  2 times daily        10/04/23 0822    Misc. Devices (SITZ BATH) MISC  As needed        10/04/23 1610              Lunette Stands, PA-C 10/04/23 9604    Gwyneth Sprout, MD 10/05/23 1434

## 2023-10-04 NOTE — ED Triage Notes (Signed)
 Pt. Arrives pov for abdominal cramping and a possible hemorrhoid x1 day. Pt. Is currently on her menstrual cycle.

## 2023-10-05 ENCOUNTER — Other Ambulatory Visit (HOSPITAL_COMMUNITY): Payer: Self-pay

## 2023-10-05 ENCOUNTER — Ambulatory Visit

## 2023-10-05 VITALS — BP 101/66 | HR 89 | Temp 98.2°F | Resp 16 | Ht 64.0 in | Wt 134.2 lb

## 2023-10-05 DIAGNOSIS — D500 Iron deficiency anemia secondary to blood loss (chronic): Secondary | ICD-10-CM

## 2023-10-05 DIAGNOSIS — D509 Iron deficiency anemia, unspecified: Secondary | ICD-10-CM

## 2023-10-05 MED ORDER — SODIUM CHLORIDE 0.9 % IV SOLN
510.0000 mg | Freq: Once | INTRAVENOUS | Status: AC
  Administered 2023-10-05: 510 mg via INTRAVENOUS
  Filled 2023-10-05: qty 17

## 2023-10-05 NOTE — Patient Instructions (Signed)

## 2023-10-05 NOTE — Progress Notes (Signed)
 Diagnosis: Iron Deficiency Anemia  Provider:  Chilton Greathouse MD  Procedure: IV Infusion  IV Type: Peripheral, IV Location: L Antecubital  Feraheme (Ferumoxytol), Dose: 510 mg  Infusion Start Time: 1036  Infusion Stop Time: 1052  Post Infusion IV Care: Observation period completed and Peripheral IV Discontinued  Discharge: Condition: Good, Destination: Home . AVS Declined  Performed by:  Adriana Mccallum, RN

## 2023-10-07 ENCOUNTER — Other Ambulatory Visit (HOSPITAL_COMMUNITY): Payer: Self-pay

## 2023-10-09 ENCOUNTER — Encounter: Payer: Self-pay | Admitting: Pulmonary Disease

## 2023-10-09 ENCOUNTER — Other Ambulatory Visit (HOSPITAL_COMMUNITY): Payer: Self-pay

## 2023-10-12 ENCOUNTER — Ambulatory Visit

## 2023-10-18 ENCOUNTER — Ambulatory Visit

## 2023-11-03 ENCOUNTER — Encounter: Payer: Self-pay | Admitting: Pulmonary Disease

## 2023-11-03 ENCOUNTER — Other Ambulatory Visit (HOSPITAL_COMMUNITY): Payer: Self-pay

## 2023-11-13 ENCOUNTER — Other Ambulatory Visit (HOSPITAL_COMMUNITY): Payer: Self-pay

## 2023-11-23 ENCOUNTER — Other Ambulatory Visit: Payer: Self-pay

## 2023-11-23 ENCOUNTER — Encounter (HOSPITAL_BASED_OUTPATIENT_CLINIC_OR_DEPARTMENT_OTHER): Payer: Self-pay | Admitting: Emergency Medicine

## 2023-11-23 ENCOUNTER — Emergency Department (HOSPITAL_BASED_OUTPATIENT_CLINIC_OR_DEPARTMENT_OTHER)
Admission: EM | Admit: 2023-11-23 | Discharge: 2023-11-23 | Discharge status: Against Medical Advice | Attending: Emergency Medicine | Admitting: Emergency Medicine

## 2023-11-23 DIAGNOSIS — M545 Low back pain, unspecified: Secondary | ICD-10-CM | POA: Diagnosis present

## 2023-11-23 DIAGNOSIS — M549 Dorsalgia, unspecified: Secondary | ICD-10-CM | POA: Diagnosis not present

## 2023-11-23 DIAGNOSIS — Z5321 Procedure and treatment not carried out due to patient leaving prior to being seen by health care provider: Secondary | ICD-10-CM | POA: Diagnosis not present

## 2023-11-23 MED ORDER — IBUPROFEN 400 MG PO TABS
400.0000 mg | ORAL_TABLET | Freq: Once | ORAL | Status: AC | PRN
  Administered 2023-11-23: 400 mg via ORAL
  Filled 2023-11-23: qty 1

## 2023-11-23 MED ORDER — OXYCODONE-ACETAMINOPHEN 5-325 MG PO TABS
1.0000 | ORAL_TABLET | ORAL | Status: DC | PRN
  Filled 2023-11-23: qty 1

## 2023-11-23 NOTE — ED Triage Notes (Signed)
 BIB [facility] from home. C/o lower back pain x 3 days. States pain is worse at night and states pain started once she started sleeping on a couch. Denies urinary symptoms.,

## 2023-11-23 NOTE — ED Notes (Signed)
 Patient left.

## 2023-12-14 ENCOUNTER — Encounter (HOSPITAL_COMMUNITY): Payer: Self-pay

## 2023-12-14 ENCOUNTER — Emergency Department (HOSPITAL_COMMUNITY)
Admission: EM | Admit: 2023-12-14 | Discharge: 2023-12-14 | Disposition: A | Discharge status: Home / Self Care | Attending: Emergency Medicine | Admitting: Emergency Medicine

## 2023-12-14 ENCOUNTER — Other Ambulatory Visit: Payer: Self-pay

## 2023-12-14 DIAGNOSIS — M545 Low back pain, unspecified: Secondary | ICD-10-CM | POA: Diagnosis present

## 2023-12-14 DIAGNOSIS — S39012A Strain of muscle, fascia and tendon of lower back, initial encounter: Secondary | ICD-10-CM

## 2023-12-14 DIAGNOSIS — R350 Frequency of micturition: Secondary | ICD-10-CM | POA: Diagnosis not present

## 2023-12-14 DIAGNOSIS — N390 Urinary tract infection, site not specified: Secondary | ICD-10-CM

## 2023-12-14 LAB — URINALYSIS, ROUTINE W REFLEX MICROSCOPIC
Bilirubin Urine: NEGATIVE
Glucose, UA: NEGATIVE mg/dL
Ketones, ur: 20 mg/dL — AB
Nitrite: POSITIVE — AB
Protein, ur: 100 mg/dL — AB
Specific Gravity, Urine: 1.029 (ref 1.005–1.030)
WBC, UA: >50 WBC/hpf (ref 0–5)
pH: 5 (ref 5.0–8.0)

## 2023-12-14 LAB — POC URINE PREG, ED: Preg Test, Ur: NEGATIVE

## 2023-12-14 MED ORDER — IBUPROFEN 200 MG PO TABS
400.0000 mg | ORAL_TABLET | Freq: Once | ORAL | Status: AC
  Administered 2023-12-14: 400 mg via ORAL
  Filled 2023-12-14: qty 2

## 2023-12-14 MED ORDER — METHOCARBAMOL 500 MG PO TABS: 500.0000 mg | ORAL_TABLET | Freq: Two times a day (BID) | ORAL | 0 refills | Status: DC

## 2023-12-14 MED ORDER — ACETAMINOPHEN 325 MG PO TABS
650.0000 mg | ORAL_TABLET | Freq: Once | ORAL | Status: AC
  Administered 2023-12-14: 650 mg via ORAL
  Filled 2023-12-14: qty 2

## 2023-12-14 MED ORDER — CEPHALEXIN 500 MG PO CAPS
500.0000 mg | ORAL_CAPSULE | Freq: Four times a day (QID) | ORAL | 0 refills | Status: DC
Start: 2023-12-14 — End: 2024-05-25

## 2023-12-14 NOTE — ED Triage Notes (Signed)
 Pt arrives via POV. Pt reports urinary frequency and a odor to her urine for the past couple of days. Reports lower back discomfort as well. PT AxOx4.

## 2023-12-14 NOTE — ED Provider Notes (Signed)
 [place] EMERGENCY DEPARTMENT AT [facility] Provider Note   CSN: [id number] Arrival date & time: 12/14/23  4098     History  Chief Complaint  Patient presents with   Urinary Frequency   Back Pain    Morgan Myers is a 28 y.o. female.  28 year old female presents with right-sided lower back pain.  States pain is positional with certain movements.  No radicular symptoms.  No loss of bowel or bladder function.  Has noted some urinary frequency.  Denies any dysuria or have a hematuria.  No vaginal bleeding.  No treatment use prior to arrival      Home Medications Prior to Admission medications   Medication Sig Start Date End Date Taking? Authorizing Provider  dicyclomine  (BENTYL ) 20 MG tablet Take 1 tablet (20 mg total) by mouth 2 (two) times daily as needed for spasms. 02/10/18   Ballard Bongo, MD  fluconazole  (DIFLUCAN ) 150 MG tablet Take 1 tablet (150 mg total) by mouth once as needed for up to 1 dose (Vaginal itching). 09/27/23   Deatra Face, MD  hydrocortisone  (ANUSOL -HC) 2.5 % rectal cream Place 1 Application rectally 2 (two) times daily. 10/04/23   Bauer, Collin S, PA-C  ibuprofen  (ADVIL ) 600 MG tablet Take 1 tablet (600 mg total) by mouth every 8 (eight) hours as needed for moderate pain (pain score 4-6). 09/27/23   Deatra Face, MD  iron  polysaccharides (NIFEREX) 150 MG capsule Take 1 capsule (150 mg total) by mouth daily. 09/27/23   Deatra Face, MD  metroNIDAZOLE  (FLAGYL ) 500 MG tablet Take 1 tablet (500 mg total) by mouth 2 (two) times daily. 09/27/23   Deatra Face, MD  Misc. Devices (SITZ BATH) MISC 1 Device by Does not apply route as needed. 10/04/23   Bauer, Collin S, PA-C      Allergies    Iron  and Sulfa antibiotics    Review of Systems   Review of Systems  All other systems reviewed and are negative.   Physical Exam Updated Vital Signs BP 111/68 (BP Location: Left Arm)   Pulse 76   Temp 98.1 F (36.7 C) (Oral)   Resp 18   Ht  1.626 m (5\' 4" )   Wt 61.2 kg   SpO2 98%   BMI 23.17 kg/m  Physical Exam Vitals and nursing note reviewed.  Constitutional:      General: She is not in acute distress.    Appearance: Normal appearance. She is well-developed. She is not toxic-appearing.  HENT:     Head: Normocephalic and atraumatic.  Eyes:     General: Lids are normal.     Conjunctiva/sclera: Conjunctivae normal.     Pupils: Pupils are equal, round, and reactive to light.  Neck:     Thyroid: No thyroid mass.     Trachea: No tracheal deviation.  Cardiovascular:     Rate and Rhythm: Normal rate and regular rhythm.     Heart sounds: Normal heart sounds. No murmur heard.    No gallop.  Pulmonary:     Effort: Pulmonary effort is normal. No respiratory distress.     Breath sounds: Normal breath sounds. No stridor. No decreased breath sounds, wheezing, rhonchi or rales.  Abdominal:     General: There is no distension.     Palpations: Abdomen is soft.     Tenderness: There is no abdominal tenderness. There is no rebound.  Musculoskeletal:        General: No tenderness. Normal range of motion.  Cervical back: Normal range of motion and neck supple.       Back:  Skin:    General: Skin is warm and dry.     Findings: No abrasion or rash.  Neurological:     Mental Status: She is alert and oriented to person, place, and time. Mental status is at baseline.     GCS: GCS eye subscore is 4. GCS verbal subscore is 5. GCS motor subscore is 6.     Cranial Nerves: No cranial nerve deficit.     Sensory: No sensory deficit.     Motor: Motor function is intact.  Psychiatric:        Attention and Perception: Attention normal.        Speech: Speech normal.        Behavior: Behavior normal.    ED Results / Procedures / Treatments   Labs (all labs ordered are listed, but only abnormal results are displayed) Labs Reviewed  URINALYSIS, ROUTINE W REFLEX MICROSCOPIC  POC URINE PREG, ED    EKG None  Radiology No results  found.  Procedures Procedures    Medications Ordered in ED Medications  acetaminophen  (TYLENOL ) tablet 650 mg (has no administration in time range)  ibuprofen  (ADVIL ) tablet 400 mg (has no administration in time range)    ED Course/ Medical Decision Making/ A&P                                 Medical Decision Making Amount and/or Complexity of Data Reviewed Labs: ordered.  Risk OTC drugs.   Patient MSK back pain.  Urinalysis shows infection.  Do not think that she has [place].  Will place on antibiotics and discharge        Final Clinical Impression(s) / ED Diagnoses Final diagnoses:  None    Rx / DC Orders ED Discharge Orders     None         Lind Repine, MD 12/14/23 1059

## 2024-05-11 DIAGNOSIS — Z1151 Encounter for screening for human papillomavirus (HPV): Secondary | ICD-10-CM | POA: Diagnosis not present

## 2024-05-11 DIAGNOSIS — Z0001 Encounter for general adult medical examination with abnormal findings: Secondary | ICD-10-CM | POA: Diagnosis not present

## 2024-05-11 DIAGNOSIS — B3731 Acute candidiasis of vulva and vagina: Secondary | ICD-10-CM | POA: Diagnosis not present

## 2024-05-25 ENCOUNTER — Ambulatory Visit (HOSPITAL_COMMUNITY)
Admission: EM | Admit: 2024-05-25 | Discharge: 2024-05-25 | Disposition: A | Discharge status: Home / Self Care | Attending: Nurse Practitioner | Admitting: Nurse Practitioner

## 2024-05-25 ENCOUNTER — Encounter (HOSPITAL_COMMUNITY): Payer: Self-pay

## 2024-05-25 DIAGNOSIS — R0600 Dyspnea, unspecified: Secondary | ICD-10-CM | POA: Diagnosis not present

## 2024-05-25 MED ORDER — ALBUTEROL SULFATE HFA 108 (90 BASE) MCG/ACT IN AERS: 2.0000 | INHALATION_SPRAY | RESPIRATORY_TRACT | 0 refills | Status: AC | PRN

## 2024-05-25 NOTE — ED Provider Notes (Signed)
 [facility]    CSN: [id number] Arrival date & time: 05/25/24  1340      History   Chief Complaint Chief Complaint  Patient presents with   Medication Refill   Shortness of Breath    HPI Morgan Myers is a 28 y.o. female.   Discussed the use of AI scribe software for clinical note transcription with the patient, who gave verbal consent to proceed.   The patient presents requesting a refill of her albuterol  inhaler. She reports a family history of asthma and believes she may have asthma as well, noting that she was previously given an inhaler for episodes of shortness of breath, which has been effective in relieving her symptoms. She describes intermittent episodes of shortness of breath over the past several days to weeks, accompanied by chest tightness, a dry cough, and palpitations. She states that the albuterol  inhaler provides noticeable improvement during these episodes.  She denies chest pain, lower extremity swelling, nausea, vomiting, hemoptysis, headaches, dizziness, fever, or recent sick contacts. She notes mild congestion and sneezing but denies sore throat or sinus pressure. She does not smoke, vape, or use hormonal birth control.  The following sections of the patient's history were reviewed and updated as appropriate: allergies, current medications, past family history, past medical history, past social history, past surgical history, and problem list.     Past Medical History:  Diagnosis Date   Anemia     Patient Active Problem List   Diagnosis Date Noted   Anemia, blood loss 09/27/2023    History reviewed. No pertinent surgical history.  OB History   No obstetric history on file.      Home Medications    Prior to Admission medications   Medication Sig Start Date End Date Taking? Authorizing Provider  albuterol  (VENTOLIN  HFA) 108 (90 Base) MCG/ACT inhaler Inhale 2 puffs into the lungs every 4 (four) hours as needed for wheezing or  shortness of breath (bronchospasms). 05/25/24  Yes Iola Lukes, FNP  iron  polysaccharides (NIFEREX) 150 MG capsule Take 1 capsule (150 mg total) by mouth daily. 09/27/23  Yes Charlyn Sora, MD    Family History Family History  Problem Relation Age of Onset   Heart failure Mother    Hypertension Father     Social History Social History   Tobacco Use   Smoking status: Never   Smokeless tobacco: Never  Vaping Use   Vaping status: Never Used  Substance Use Topics   Alcohol use: No   Drug use: No     Allergies   Iron  and Sulfa antibiotics   Review of Systems Review of Systems  Constitutional:  Negative for fever.  HENT:  Positive for congestion (sometimes), rhinorrhea (sometimes) and sneezing (sometimes). Negative for sore throat.   Respiratory:  Positive for cough (dry), chest tightness and shortness of breath. Negative for wheezing.   Cardiovascular:  Positive for palpitations (only during episodes of shortness of breath). Negative for chest pain and leg swelling.  Gastrointestinal:  Negative for nausea and vomiting.  Neurological:  Negative for dizziness and headaches.  All other systems reviewed and are negative.    Physical Exam Triage Vital Signs ED Triage Vitals  Encounter Vitals Group     BP 05/25/24 1455 110/77     Girls Systolic BP Percentile --      Girls Diastolic BP Percentile --      Boys Systolic BP Percentile --      Boys Diastolic BP Percentile --  Pulse Rate 05/25/24 1455 80     Resp 05/25/24 1455 16     Temp 05/25/24 1455 98.3 F (36.8 C)     Temp Source 05/25/24 1455 Oral     SpO2 05/25/24 1455 97 %     Weight 05/25/24 1455 145 lb (65.8 kg)     Height 05/25/24 1455 5' 4 (1.626 m)     Head Circumference --      Peak Flow --      Pain Score 05/25/24 1453 0     Pain Loc --      Pain Education --      Exclude from Growth Chart --    No data found.  Updated Vital Signs BP 110/77 (BP Location: Left Arm)   Pulse 80   Temp 98.3  F (36.8 C) (Oral)   Resp 16   Ht 5' 4 (1.626 m)   Wt 145 lb (65.8 kg)   LMP 05/01/2024 (Approximate)   SpO2 97%   BMI 24.89 kg/m   Visual Acuity Right Eye Distance:   Left Eye Distance:   Bilateral Distance:    Right Eye Near:   Left Eye Near:    Bilateral Near:     Physical Exam Vitals reviewed.  Constitutional:      General: She is awake. She is not in acute distress.    Appearance: Normal appearance. She is well-developed. She is not ill-appearing, toxic-appearing or diaphoretic.  HENT:     Head: Normocephalic.     Right Ear: Hearing normal.     Left Ear: Hearing normal.     Nose: Nose normal.     Mouth/Throat:     Mouth: Mucous membranes are moist.  Eyes:     General: Vision grossly intact.     Conjunctiva/sclera: Conjunctivae normal.  Cardiovascular:     Rate and Rhythm: Normal rate and regular rhythm.     Pulses: Normal pulses.     Heart sounds: Normal heart sounds.  Pulmonary:     Effort: Pulmonary effort is normal. No respiratory distress.     Breath sounds: Normal breath sounds and air entry. No decreased air movement. No decreased breath sounds, wheezing or rhonchi.     Comments: Respirations even and unlabored  Musculoskeletal:        General: Normal range of motion.     Cervical back: Normal range of motion and neck supple.     Right lower leg: No edema.     Left lower leg: No edema.  Skin:    General: Skin is warm and dry.  Neurological:     General: No focal deficit present.     Mental Status: She is alert and oriented to person, place, and time.  Psychiatric:        Speech: Speech normal.        Behavior: Behavior is cooperative.      UC Treatments / Results  Labs (all labs ordered are listed, but only abnormal results are displayed) Labs Reviewed - No data to display  EKG   Radiology No results found.  Procedures Procedures (including critical care time)  Medications Ordered in UC Medications - No data to display  Initial  Impression / Assessment and Plan / UC Course  I have reviewed the triage vital signs and the nursing notes.  Pertinent labs & imaging results that were available during my care of the patient were reviewed by me and considered in my medical decision making (see chart for details).  The patient presents requesting an albuterol  refill after experiencing intermittent episodes of shortness of breath over the past several days to weeks, accompanied by chest tightness, dry cough, and palpitations. She has a family history of asthma and reports that albuterol  has reliably improved her symptoms, although she has never been formally diagnosed with asthma. She also reports occasional congestion and sneezing but denies fever, chest pain, lower extremity swelling, nausea, vomiting, recent illness exposure, smoking, or birth control use. On exam, her oxygen saturation is 97%, and lung sounds are clear bilaterally. Her symptoms and response to albuterol  are consistent with a possible reactive airway process, though further evaluation is needed for an official diagnosis. An albuterol  inhaler refill was provided. She was advised to follow up with pulmonology for pulmonary function testing and further diagnostic evaluation. Emergency precautions were reviewed, including seeking immediate care for worsening shortness of breath, chest pain, fainting, or inability to catch her breath.  Today's evaluation has revealed no signs of a dangerous process. Discussed diagnosis with patient and/or guardian. Patient and/or guardian aware of their diagnosis, possible red flag symptoms to watch out for and need for close follow up. Patient and/or guardian understands verbal and written discharge instructions. Patient and/or guardian comfortable with plan and disposition.  Patient and/or guardian has a clear mental status at this time, good insight into illness (after discussion and teaching) and has clear judgment to make decisions  regarding their care  Documentation was completed with the aid of voice recognition software. Transcription may contain typographical errors.  Final Clinical Impressions(s) / UC Diagnoses   Final diagnoses:  Dyspnea, unspecified type     Discharge Instructions      You were seen today for a refill of your albuterol  inhaler after having episodes of shortness of breath over the past several days to weeks. Your breathing exam was normal, and your oxygen level was good at 97%. Your symptoms, including chest tightness and dry cough that improve with albuterol , may be related to asthma or another type of reactive airway condition, even though you have not been officially diagnosed yet.  Use your albuterol  inhaler as directed when you feel short of breath, have chest tightness, or start coughing. It should help open your airways and make breathing easier. If you notice that you need to use your inhaler more often than usual, write this down and let a doctor know. Congestion and sneezing may also contribute to your symptoms, so using over-the-counter allergy medications like antihistamines may help if needed.  It is important for you to follow up with a lung specialist (pulmonologist) to get pulmonary function testing, which will help determine whether you have asthma or another breathing condition. You were given information for the pulmonary clinic, and you should call to schedule an appointment.  Please go to the emergency room right away if you develop severe or worsening shortness of breath, chest pain, trouble speaking in full sentences, fainting, or if your inhaler stops helping.      ED Prescriptions     Medication Sig Dispense Auth. Provider   albuterol  (VENTOLIN  HFA) 108 (90 Base) MCG/ACT inhaler Inhale 2 puffs into the lungs every 4 (four) hours as needed for wheezing or shortness of breath (bronchospasms). 18 g Iola Lukes, FNP      PDMP not reviewed this encounter.    Iola Lukes, FNP 05/25/24 1520

## 2024-05-25 NOTE — ED Triage Notes (Signed)
 Patient was prescribed an inhaler for SOB but has ran out. Has family history of Asthma. Patient does not have dx for asthma but has history of SOB episodes.   Requesting refill of her Inhaler. Patient states does currently have an occasional dry cough but denies any other symptoms or sick symptoms.

## 2024-05-25 NOTE — Discharge Instructions (Addendum)
 You were seen today for a refill of your albuterol  inhaler after having episodes of shortness of breath over the past several days to weeks. Your breathing exam was normal, and your oxygen level was good at 97%. Your symptoms, including chest tightness and dry cough that improve with albuterol , may be related to asthma or another type of reactive airway condition, even though you have not been officially diagnosed yet.  Use your albuterol  inhaler as directed when you feel short of breath, have chest tightness, or start coughing. It should help open your airways and make breathing easier. If you notice that you need to use your inhaler more often than usual, write this down and let a doctor know. Congestion and sneezing may also contribute to your symptoms, so using over-the-counter allergy medications like antihistamines may help if needed.  It is important for you to follow up with a lung specialist (pulmonologist) to get pulmonary function testing, which will help determine whether you have asthma or another breathing condition. You were given information for the pulmonary clinic, and you should call to schedule an appointment.  Please go to the emergency room right away if you develop severe or worsening shortness of breath, chest pain, trouble speaking in full sentences, fainting, or if your inhaler stops helping.

## 2024-06-24 ENCOUNTER — Encounter (HOSPITAL_BASED_OUTPATIENT_CLINIC_OR_DEPARTMENT_OTHER): Payer: Self-pay | Admitting: Emergency Medicine

## 2024-06-24 ENCOUNTER — Other Ambulatory Visit: Payer: Self-pay

## 2024-06-24 ENCOUNTER — Emergency Department (HOSPITAL_BASED_OUTPATIENT_CLINIC_OR_DEPARTMENT_OTHER)
Admission: EM | Admit: 2024-06-24 | Discharge: 2024-06-24 | Disposition: A | Discharge status: Home / Self Care | Attending: Emergency Medicine | Admitting: Emergency Medicine

## 2024-06-24 DIAGNOSIS — N760 Acute vaginitis: Secondary | ICD-10-CM | POA: Diagnosis not present

## 2024-06-24 DIAGNOSIS — B9689 Other specified bacterial agents as the cause of diseases classified elsewhere: Secondary | ICD-10-CM

## 2024-06-24 DIAGNOSIS — Z2914 Encounter for prophylactic rabies immune globin: Secondary | ICD-10-CM | POA: Diagnosis not present

## 2024-06-24 DIAGNOSIS — Z23 Encounter for immunization: Secondary | ICD-10-CM

## 2024-06-24 LAB — PREGNANCY, URINE: Preg Test, Ur: NEGATIVE

## 2024-06-24 LAB — URINALYSIS, ROUTINE W REFLEX MICROSCOPIC
Bilirubin Urine: NEGATIVE
Glucose, UA: NEGATIVE mg/dL
Hgb urine dipstick: NEGATIVE
Ketones, ur: NEGATIVE mg/dL
Nitrite: NEGATIVE
Protein, ur: NEGATIVE mg/dL
Specific Gravity, Urine: >=1.03 (ref 1.005–1.030)
pH: 6 (ref 5.0–8.0)

## 2024-06-24 LAB — URINALYSIS, MICROSCOPIC (REFLEX)

## 2024-06-24 LAB — WET PREP, GENITAL
Sperm: NONE SEEN
Trich, Wet Prep: NONE SEEN
WBC, Wet Prep HPF POC: <10 (ref <10)
Yeast Wet Prep HPF POC: NONE SEEN

## 2024-06-24 MED ORDER — FLUCONAZOLE 150 MG PO TABS: 150.0000 mg | ORAL_TABLET | Freq: Every day | ORAL | 0 refills | Status: DC

## 2024-06-24 MED ORDER — METRONIDAZOLE 500 MG PO TABS: 500.0000 mg | ORAL_TABLET | Freq: Two times a day (BID) | ORAL | 0 refills | Status: AC

## 2024-06-24 MED ORDER — RABIES IMMUNE GLOBULIN 300 UNIT/2ML IJ SOLN
20.0000 [IU]/kg | Freq: Once | INTRAMUSCULAR | Status: AC
  Administered 2024-06-24: 1200 [IU]
  Filled 2024-06-24: qty 8

## 2024-06-24 MED ORDER — RABIES VIRUS VACCINE, HDC IM SUSR
1.0000 mL | Freq: Once | INTRAMUSCULAR | Status: AC
  Administered 2024-06-24: 1 mL via INTRAMUSCULAR
  Filled 2024-06-24 (×2): qty 1

## 2024-06-24 NOTE — ED Provider Notes (Signed)
  EMERGENCY DEPARTMENT AT [facility] Provider Note   CSN: [id number] Arrival date & time: 06/24/24  1507     Patient presents with: Possible Rabies Exposure   Morgan Myers is a 28 y.o. female.  Patient is a 28 year old female with no significant medical history who presents to the ED for increasing vaginal discharge over the past couple days.  Notes she had discharge approximately a month ago and was on treatment.  She states she did drink alcohol with this and feels like it may have not worked properly.  She is unsure what medication she was on.  Notes that she noticed the smell and discharge increasing today prompting her to want to get checked.  States her menstrual cycle is due to start any day.  Denies concerns for pregnancy or STIs.  Denies fevers, abdominal pain, pelvic pain, urinary symptoms.  Patient also requesting a rabies vaccination.  She notes her sister brought 2 cats over to the house that died yesterday morning.  They took the cat to the vet as they were acting strangely and the that told them they most likely had rabies.  The vet advised that anyone in contact with the cats be vaccinated.  Patient states she was not bit or scratched at all.  No further complaints.   HPI     Prior to Admission medications  Medication Sig Start Date End Date Taking? Authorizing Provider  fluconazole  (DIFLUCAN ) 150 MG tablet Take 1 tablet (150 mg total) by mouth daily. 06/24/24  Yes Kimberely Mccannon, Thersia RAMAN, PA-C  metroNIDAZOLE  (FLAGYL ) 500 MG tablet Take 1 tablet (500 mg total) by mouth 2 (two) times daily for 7 days. 06/24/24 07/01/24 Yes Tahjae Clausing, Thersia RAMAN, PA-C  albuterol  (VENTOLIN  HFA) 108 (90 Base) MCG/ACT inhaler Inhale 2 puffs into the lungs every 4 (four) hours as needed for wheezing or shortness of breath (bronchospasms). 05/25/24   Iola Lukes, FNP  iron  polysaccharides (NIFEREX) 150 MG capsule Take 1 capsule (150 mg total) by mouth daily. 09/27/23   Charlyn Sora, MD    Allergies: Iron  and Sulfa antibiotics    Review of Systems  Constitutional:  Negative for chills and fever.  Respiratory:  Negative for shortness of breath.   Cardiovascular:  Negative for chest pain.  Genitourinary:  Positive for vaginal discharge. Negative for dysuria, vaginal bleeding and vaginal pain.  Neurological:  Negative for weakness and headaches.  All other systems reviewed and are negative.   Updated Vital Signs BP 125/83 (BP Location: Right Arm)   Pulse 82   Temp 98.4 F (36.9 C) (Oral)   Resp 17   Ht 5' 4 (1.626 m)   Wt 64.9 kg   LMP 05/01/2024 (Approximate)   SpO2 100%   BMI 24.55 kg/m   Physical Exam Constitutional:      Appearance: Normal appearance.  HENT:     Head: Normocephalic and atraumatic.     Nose: Nose normal.     Mouth/Throat:     Mouth: Mucous membranes are moist.     Pharynx: Oropharynx is clear.  Cardiovascular:     Rate and Rhythm: Normal rate.  Pulmonary:     Effort: Pulmonary effort is normal.  Abdominal:     General: Bowel sounds are normal.     Palpations: Abdomen is soft.     Tenderness: There is no abdominal tenderness.  Musculoskeletal:        General: Normal range of motion.  Skin:    General: Skin is warm  and dry.  Neurological:     Mental Status: She is alert and oriented to person, place, and time.  Psychiatric:        Mood and Affect: Mood normal.        Behavior: Behavior normal.     (all labs ordered are listed, but only abnormal results are displayed) Labs Reviewed  WET PREP, GENITAL - Abnormal; Notable for the following components:      Result Value   Clue Cells Wet Prep HPF POC PRESENT (*)    All other components within normal limits  URINALYSIS, ROUTINE W REFLEX MICROSCOPIC - Abnormal; Notable for the following components:   Leukocytes,Ua TRACE (*)    All other components within normal limits  URINALYSIS, MICROSCOPIC (REFLEX) - Abnormal; Notable for the following components:   Bacteria, UA  MANY (*)    All other components within normal limits  PREGNANCY, URINE  GC/CHLAMYDIA PROBE AMP ([place]) NOT AT [facility]    EKG: None  Radiology: No results found.    Medications Ordered in the ED  rabies vaccine , human diploid (IMOVAX) injection 1 mL (1 mL Intramuscular Given 06/24/24 1650)  rabies immune globulin  (HYPERRAB) injection 1,200 Units (1,200 Units Infiltration Given 06/24/24 1650)                                   Medical Decision Making Patient is a 28 year old female with no significant medical history who presents to the ED requesting rabies vaccination as well as vaginal discharge.  Please see detailed HPI above.  On exam patient is alert and well-appearing.  Physical exam as noted above.  No abdominal tenderness appreciated.  Wet prep is positive for clue cells, suspect acute BV.  Otherwise UA appears contaminated, denies urinary symptoms.  Urine pregnancy negative.  Less concerns for PID, TOA, ovarian torsion as patient is well-appearing with no acute tenderness.  Patient also request rabies vaccination after potential exposure to rabid kittens yesterday.  She was instructed by the vet to come to the ED for rabies vaccination.  Patient and her mother both would like the rabies vaccinations to be safe.  Patient was given immunoglobulin as well as first rabies vaccination while in ED.  No concerns for prophylactic antibiotics or Tdap as there is no bites or scratches noted.  Stable for discharge home today.  Prescribed Flagyl  and Diflucan .  Symptomatic care discussed.  Advised to follow-up on days 3, 7, 14 for repeat rabies vaccinations.  Resources provided for follow-up.  Return precautions provided for worsening symptoms.  Amount and/or Complexity of Data Reviewed Labs: ordered.  Risk Prescription drug management.      Final diagnoses:  Rabies, need for prophylactic vaccination against  Bacterial vaginosis    ED Discharge Orders          Ordered     metroNIDAZOLE  (FLAGYL ) 500 MG tablet  2 times daily        06/24/24 1641    fluconazole  (DIFLUCAN ) 150 MG tablet  Daily        06/24/24 1641               Neysa Thersia RAMAN, [place] 06/24/24 1657    Cottie Donnice PARAS, MD 06/24/24 1753

## 2024-06-24 NOTE — ED Triage Notes (Signed)
 Pt had 2 possibly rabid cats in her home yesterday; no bites or scratches; was advised by [facility] to receive rabies vaccine ; also c/o vaginal discharge/irritation that is unrelated

## 2024-06-24 NOTE — Discharge Instructions (Signed)
 Take Flagyl  and Diflucan  as prescribed.  Avoid douching or putting anything else in the vaginal area.  Results of STI testing will be on MyChart in approximately 48 hours.  Please follow-up with gynecologist in 1 week for any continued concerns.  Follow-up at urgent care discussed for rabies vaccinations on days 3, 7, 14.  You do not need to follow-up if the cat test negative for rabies in the meantime.  Return to ED for any worsening symptoms including severe abdominal pain, new fevers, worsening vaginal discharge, severe uncontrollable headache.

## 2024-06-24 NOTE — ED Notes (Signed)
 Patient transferred from waiting room to ED treatment room. Assuming pt care at this time.

## 2024-06-26 LAB — GC/CHLAMYDIA PROBE AMP (~~LOC~~) NOT AT ARMC
Chlamydia: NEGATIVE
Comment: NEGATIVE
Comment: NORMAL
Neisseria Gonorrhea: NEGATIVE

## 2024-06-28 ENCOUNTER — Encounter (HOSPITAL_COMMUNITY): Payer: Self-pay | Admitting: Emergency Medicine

## 2024-06-28 ENCOUNTER — Ambulatory Visit (HOSPITAL_COMMUNITY)
Admission: EM | Admit: 2024-06-28 | Discharge: 2024-06-28 | Disposition: A | Discharge status: Home / Self Care | Attending: Family Medicine | Admitting: Family Medicine

## 2024-06-28 DIAGNOSIS — Z203 Contact with and (suspected) exposure to rabies: Secondary | ICD-10-CM | POA: Diagnosis not present

## 2024-06-28 MED ORDER — RABIES VIRUS VACCINE, HDC IM SUSR
INTRAMUSCULAR | Status: AC
  Filled 2024-06-28: qty 1

## 2024-06-28 MED ORDER — RABIES VIRUS VACCINE, HDC IM SUSR
1.0000 mL | Freq: Once | INTRAMUSCULAR | Status: AC
  Administered 2024-06-28: 14:00:00 1 mL via INTRAMUSCULAR

## 2024-06-28 NOTE — ED Triage Notes (Signed)
 Pt needing 2nd rabies vaccination. Denies complications

## 2024-08-16 ENCOUNTER — Ambulatory Visit (HOSPITAL_COMMUNITY)
Admission: EM | Admit: 2024-08-16 | Discharge: 2024-08-16 | Disposition: A | Discharge status: Home / Self Care | Source: Home / Self Care | Attending: Family Medicine | Admitting: Family Medicine

## 2024-08-16 ENCOUNTER — Other Ambulatory Visit: Payer: Self-pay

## 2024-08-16 ENCOUNTER — Encounter (HOSPITAL_COMMUNITY): Payer: Self-pay | Admitting: Emergency Medicine

## 2024-08-16 DIAGNOSIS — Z113 Encounter for screening for infections with a predominantly sexual mode of transmission: Secondary | ICD-10-CM

## 2024-08-16 DIAGNOSIS — N898 Other specified noninflammatory disorders of vagina: Secondary | ICD-10-CM

## 2024-08-16 MED ORDER — METRONIDAZOLE 500 MG PO TABS: 500.0000 mg | ORAL_TABLET | Freq: Two times a day (BID) | ORAL | 0 refills | Status: AC

## 2024-08-16 MED ORDER — FLUCONAZOLE 150 MG PO TABS: ORAL_TABLET | ORAL | 0 refills | Status: AC

## 2024-08-16 NOTE — Discharge Instructions (Signed)
 We have sent testing for sexually transmitted infections. We will notify you of any positive results once they are received. If required, we will prescribe any medications you might need.  Please refrain from all sexual activity for at least the next seven days.

## 2024-08-16 NOTE — ED Triage Notes (Signed)
 Patient has noticed a vaginal odor.  Reports a vaginal discharge.  Patient is also requesting a mouth swab .

## 2024-08-17 LAB — CERVICOVAGINAL ANCILLARY ONLY
Bacterial Vaginitis (gardnerella): POSITIVE — AB
Candida Glabrata: NEGATIVE
Candida Vaginitis: POSITIVE — AB
Chlamydia: NEGATIVE
Comment: NEGATIVE
Comment: NEGATIVE
Comment: NEGATIVE
Comment: NEGATIVE
Comment: NEGATIVE
Comment: NORMAL
Neisseria Gonorrhea: NEGATIVE
Trichomonas: NEGATIVE

## 2024-08-17 LAB — CYTOLOGY, (ORAL, ANAL, URETHRAL) ANCILLARY ONLY
Chlamydia: NEGATIVE
Comment: NEGATIVE
Comment: NORMAL
Neisseria Gonorrhea: NEGATIVE

## 2024-08-18 ENCOUNTER — Ambulatory Visit (HOSPITAL_COMMUNITY): Payer: Self-pay
